# Patient Record
Sex: Female | Born: 1985 | Race: White | Hispanic: No | Marital: Married
Health system: Midwestern US, Community
[De-identification: ages and names within clinical notes are randomized; demographics above are authoritative.]

## PROBLEM LIST (undated history)

## (undated) DIAGNOSIS — R200 Anesthesia of skin: Secondary | ICD-10-CM

---

## 2008-11-24 LAB — COMPREHENSIVE METABOLIC PANEL
ALT: 16 U/L (ref 7–35)
AST: 22 U/L (ref 8–33)
Albumin,Serum: 4.5 g/dL (ref 3.5–5.0)
Alkaline Phosphatase: 83 U/L (ref 25–100)
BUN: 14 mg/dL (ref 6–23)
CO2: 22 mMol/L (ref 20–32)
Calcium: 9.2 mg/dL (ref 8.3–10.6)
Chloride: 108 mMol/L (ref 99–109)
Creatinine: 0.6 mg/dL (ref 0.6–1.1)
GFR African American: >60 mL/min/{1.73_m2}
GFR Non-African American: >60 mL/min/{1.73_m2}
Glucose, GTT - Fasting: 97 mg/dL (ref 74–106)
Potassium: 4 mmol/L (ref 3.5–5.1)
Sodium: 137 mmol/L (ref 136–145)
Total Bilirubin: 0.8 mg/dL (ref 0.3–1.2)
Total Protein: 7.1 g/dL (ref 6.4–8.3)

## 2008-11-24 LAB — CBC WITH AUTO DIFFERENTIAL
Basophils %: 1.2 % — ABNORMAL HIGH (ref 0–1)
Basophils Absolute: 0.1 10*3/uL (ref 0.0–0.1)
Eosinophils %: 2.5 % (ref 0–7)
Eosinophils Absolute: 0.2 10*3/uL (ref 0.0–0.6)
Hematocrit: 43.4 % (ref 37.0–47.0)
Hemoglobin: 14.9 g/dL (ref 12.0–16.0)
Lymphocytes %: 9.7 % — ABNORMAL LOW (ref 16–42)
Lymphocytes Absolute: 0.9 10*3/uL (ref 0.6–4.3)
MCH: 30.6 pg (ref 27–31)
MCHC: 34.3 % (ref 32.0–36.0)
MCV: 89.2 FL (ref 80–100)
MPV: 7.3 FL — ABNORMAL LOW (ref 7.5–11.1)
Monocytes %: 6 % (ref 4–12)
Monocytes Absolute: 0.6 10*3/uL (ref 0.2–1.1)
Neutrophils Absolute: 7.8 10*3/uL — ABNORMAL HIGH (ref 1.5–7.0)
Platelets: 230 10*3/uL (ref 150–400)
RBC: 4.87 10*6/uL (ref 4.20–5.40)
RDW: 13.3 % (ref 11.7–14.9)
Segs Relative: 80.6 % — ABNORMAL HIGH (ref 44–76)
WBC: 9.7 10*3/uL (ref 4.8–10.8)

## 2008-11-24 LAB — LIPASE: Lipase: 26 U/L (ref 6–51)

## 2008-11-24 LAB — AMYLASE: Amylase: 34 U/L (ref 30–118)

## 2010-01-20 ENCOUNTER — Inpatient Hospital Stay: Admit: 2010-01-20 | Discharge: 2010-01-20

## 2010-01-20 MED FILL — CLINDAMYCIN HCL 150 MG PO CAPS: 150 MG | ORAL | Qty: 1

## 2010-01-20 NOTE — Discharge Instructions (Signed)
[facility]SA OR ABSCESS Wound instructions    _x___ Warm Epsom Salt Water Compresses or Soaks Every Hour Today then 3-7 times daily for 5-7 days. Use 1/4 cup Epsom Salt to 16 Oz of hot water. Add a tablespoon of antibacterial soap to the water.    __x__ Hot Showers 2-3 times daily.    __x__ Clindamycin 321m as directed (usually 1 tablet 4 times a day)    __x__ Bactroban (Mupirocin) ointment as directed.   ____ " ribbon to each nostril 2-3 times a day and massage for one minute.   ____ Apply to the affected area 2 times a day and cover with a dressing.    __x__ Hibiclens (chlorohexidine) wash daily, head to toe, for one week then weekly. This is available over the counter at the pharmacy.     Keep wound covered and moist until healing has occurred.  This may take several weeks.      RETURN TO THE EMERGENCY DEPARTMENT IF THE AREA GETS REDDER, MORE SWOLLEN OR MORE PAINFUL.    YOU MAY CHOOSE TO FOLLOW UP AT [facility]  THEY SPECIALIZE IN SKIN INFECTIONS    THIS IS NOT A SPIDER BITE!    How can I prevent staph or MRSA skin infections?   Practice good hygiene:  1. Keep your hands clean by washing thoroughly with soap and water or using an alcohol-based hand sanitizer.   2. Keep cuts and scrapes clean and covered with a bandage until healed.   3. Avoid contact with other people's wounds or bandages.   4. Avoid sharing personal items such as towels or razors.  Information from this web site:  HBetaBlues.pl Abscess/Boil  (Furuncle)     An abscess (boil or furuncle) is an infected area that contains a collection of pus.     SYMPTOMS  Signs and symptoms of an abscess include pain, tenderness, redness, or hardness. You may feel a moveable soft area under your skin. An abscess can occur anywhere in the body.      TREATMENT  An incision (cut by the caregiver) may have been made over your abscess so the pus could be drained out. Gauze may have been packed into the space or a drain may  have been looped thru the abscess cavity (pocket). This provides a drain that will allow the cavity to heal from the inside outwards. The abscess may be painful for a few days, but should feel much better if it was drained. Your abscess, if seen early, may not have localized and may not have been drained. If not, another appointment may be required if it does not get better on its own or with medications.     See your caregiver as directed for a recheck or sooner if you develop any of the symptoms described above. Take antibiotics (medicine that kills germs) as directed if they were prescribed.     MAKE SURE YOU:    Understand these instructions.    Will watch your condition.   Will get help right away if you are not doing well or get worse.     Document Released: 11/02/2004  Document Re-Released: 11/20/2008  [facility] Information 2011 [place]              Wound Care  Good wound care will help reduce pain and prevent infection. Puncture wounds and deep cuts are more likely to become infected than scrapes and abrasions. There is a greater chance of infection if  the injured person is diabetic and/or has a weakened immune system (such as when a cancer patient is being treated with chemotherapy and/or radiation).  HOME CARE INSTRUCTIONS  General wound care treatment includes:   Rest and elevate the injured area until the pain and swelling are better.    All foreign material like bits of dirt must be washed off.    Wounds should be cleaned daily with gentle soap and water.    Dressings may be needed to protect the wound from further damage.    Apply antibiotic cream or ointment after the wound has been cleaned.   SEEK MEDICAL CARE IF YOU DEVELOP:   Increased redness or swelling in or around the wound.    Increasing pain.   SEEK IMMEDIATE MEDICAL CARE IF YOU DEVELOP:   Temperature of 102F (38.9 C) or higher.    A pus like drainage from the wound.    Severe pain not controlled with  non-prescription or prescription pain relievers.    Inability to move fingers or toes if the wound affects these areas.    Red streaking of the skin extending above and/or below the wound.   A tetanus shot may be needed if you have not had a booster within the past 5 years.  Document Released: 03/02/2004 Document Re-Released: 04/19/2009  [facility] Patient Information 2011 [place].

## 2010-01-20 NOTE — ED Provider Notes (Signed)
eMERGENCY dEPARTMENT eNCOUnter        CHIEF COMPLAINT    Chief Complaint   Patient presents with   ??? Abscess     2 abscesses, chin and abd       HPI    Jessica Krause is a 24 y.o. female who presents with a history of multiple open sores On shin and abdomen, along Multiple other wounds had varying degrees of healing.  Patient states she began having problems with open sores on her back axilla side abdomen face and scalp after she had a tattoo placed over a year ago.  She states her family doctor has treated her with at least 2 courses of Bactrim but she continues to develop open sores.  She currently an open draining sore in the center of her chin and on her lower left abdominal fold just above her belt line.  Both sores are nonfluctuant but do have draining at this time.  Patient offers several concerns and to why she continues to develop sores in concerns of possibility MRSA and passing on to her children.  Patient denies pain at the areas but is just more concerned about the infectious nature of the sores.  Patient's past medical history is significant for asthma migraines and recurring open sores.      REVIEW OF SYSTEMS    See HPI for further details. Review of systems otherwise negative.     PAST MEDICAL HISTORY    Past Medical History   Diagnosis Date   ??? Asthma    ??? Migraine        SURGICAL HISTORY    Past Surgical History   Procedure Date   ??? Cholecystectomy    ??? Hysterectomy    ??? Anterior cruciate ligament repair        CURRENT MEDICATIONS    Current Outpatient Rx   Name Route Sig Dispense Refill   ??? IBUPROFEN 200 MG PO CAPS Oral Take 4 capsules by mouth continuous prn.     ??? CLINDAMYCIN HCL 150 MG PO CAPS Oral Take 2 capsules by mouth 4 times daily for 7 days. 28 capsule 0   ??? MUPIROCIN 2 % EX OINT  Apply to nares 2 times a day 1 Tube 0       ALLERGIES    Allergies   Allergen Reactions   ??? Amoxicillin    ??? Inderal (Propranolol Hcl)        FAMILY HISTORY    History reviewed. No pertinent family  history.    SOCIAL HISTORY    History     Social History   ??? Marital Status: Married     Spouse Name: N/A     Number of Children: N/A   ??? Years of Education: N/A     Social History Main Topics   ??? Smoking status: Current Everyday Smoker   ??? Smokeless tobacco: None   ??? Alcohol Use: Yes      social   ??? Drug Use: No   ??? Sexually Active:      Other Topics Concern   ??? None     Social History Narrative   ??? None       PHYSICAL EXAM    VITAL SIGNS: BP 135/96   Pulse 106   Temp(Src) 96.5 ??F (35.8 ??C) (Oral)   Resp 16   Ht 5\' 8"  (1.727 m)   Wt 265 lb (120.203 kg)   BMI 40.29 kg/m2   SpO2 99%  Constitutional:  Well developed, well nourished, no acute distress, non-toxic appearance   HENT:  Atraumatic, external ears normal, nose normal, oropharynx moist, no pharyngeal exudates. Neck- supple   Respiratory:  No respiratory distress, normal breath sounds   Cardiovascular:  Normal rate, normal rhythm, no murmurs, no gallops, no rubs   GI:  Soft, nondistended, normal bowel sounds, nontender, no organomegaly   Musculoskeletal:  No edema, no tenderness, no deformities.   Integument:  Open sore noted on her center chin measuring half a centimeter by half centimeter currently with an area of dry healing tissue, additional sore noted on lower abdominal fold and with Small amount of drainage noted.  There is no cellulitis noted at either area.  There are several areas on her right upper back shoulder region, left and right axilla areas, right scalp, and lower right abdomen which demonstrate prior open wound scars, all areas are free of cellulitis or drainage at this time       MEDICAL DECISION MAKING    Vitals:    Filed Vitals:    01/20/10 1622   BP: 135/96   Pulse: 106   Temp: 96.5 ??F (35.8 ??C)   TempSrc: Oral   Resp: 16   Height: 5\' 8"  (1.727 m)   Weight: 265 lb (120.203 kg)   SpO2: 99%       MEDICAL DECISION MAKING / ED COURSE:      Vital signs and nursing notes reviewed during ED course.  Patient presentation and care were discussed  with Dr. Carvel Getting prior to discharge.  Patient was instructed in skin care and treatment of both current wounds and resolving wounds, which included the use of Hibiclens showers, warm compresses, Bactroban nasally, and completion of antibiotic treatment.  Patient is to followup with her primary care provider once antibiotics are completed for additional testing and concerns.  Wound cultures were not obtained at this time secondary to patient's request As patient states she knows she has had MRSA in the past.    The patient tolerated their visit well.  They were seen and evaluated by the attending physician who agreed with the assessment and plan.  The patient and / or the family were informed of the results of any tests, a time was given to answer questions, a plan was proposed and they agreed with plan.      CLINICAL IMPRESSION:  1. Wounds, multiple          DISCHARGE MEDICATIONS:  New Prescriptions    CLINDAMYCIN (CLEOCIN) 150 MG CAPSULE    Take 2 capsules by mouth 4 times daily for 7 days.    MUPIROCIN (BACTROBAN) 2 % OINTMENT    Apply to nares 2 times a day       (Please note the MDM and HPI sections of this note were completed with a voice recognition program.  Efforts were made to edit the dictations but occasionally words are mis-transcribed.)        Jessica Bouillon, NP  01/20/10 2220    This patient was seen by the mid-level provider.  I have seen and examined the patient, agree with the workup, evaluation, management and diagnosis.  Care plan has been discussed.      Jessica Saxon, MD  01/20/10 [phone]

## 2010-11-29 ENCOUNTER — Encounter

## 2010-11-29 NOTE — Patient Instructions (Signed)
Patient care instructions given.

## 2010-11-29 NOTE — Progress Notes (Signed)
Subjective:       Jessica Krause is a 25 y.o. right handed female referred by Dr. Benito Mccreedy for evaluation and treatment bilateral wrist pain, right worse than left. This is evaluated as a personal injury. The onset of the pain was gradual, starting about 2 months ago. The pain is described as numbing and tingling.   She states that she first noticed symptoms in her right hand where she was getting intermittent numbness and muscle twitches in her right thumb.  Since that time her symptoms have progressed to the point that she is now having continuous numbness and tingling and pain in the right hand.  Then over the past couple weeks she has begun noticing similar symptoms in her left hand.   The patient has had night time symptoms.  Restricted activities include: repetitive use pattern, limited grip activities, limited fine motor / sensory discrimination. The patient has not had Physical Therapy for these symptoms.  The pain is relieved by rest, avoiding the painful activities, stretching. EMG studies were not done.  She denies recent injury to her bilateral hands and denies fever or chills.  She states that now she's beginning to feel clumsy and is dropping things with her right hand.    Pain is currently rated at  7/10.    Outside reports reviewed: none.    Past Medical History   Diagnosis Date   . Asthma    . Migraine    . Arthritis      Past Surgical History   Procedure Date   . Cholecystectomy    . Hysterectomy    . Anterior cruciate ligament repair      History reviewed. No pertinent family history.  History     Social History   . Marital Status: Married     Spouse Name: N/A     Number of Children: N/A   . Years of Education: N/A     Social History Main Topics   . Smoking status: Current Everyday Smoker   . Smokeless tobacco: None   . Alcohol Use: Yes      social   . Drug Use: No   . Sexually Active:      Other Topics Concern   . None     Social History Narrative   . None     Current Outpatient  Prescriptions   Medication Sig Dispense Refill   . NAPROXEN Take  by mouth 2 times daily.         Marland Kitchen ibuprofen (ADVIL;MOTRIN) 200 MG CAPS Take 4 capsules by mouth continuous prn.         Allergies   Allergen Reactions   . Amoxicillin    . Inderal (Propranolol Hcl)         Review of Systems - General ROS: negative for - chills, fever or night sweats  Musculoskeletal ROS: positive for - joint pain bilateral hands, right worse than left  negative for - prior injury to bilateral hands  Neurological ROS: positive for - numbness/tingling or weakness  Dermatological ROS: negative for - erythema, lacerations       Objective:      BP 118/82  Pulse 76  Temp(Src) 98.9 F (37.2 C) (Temporal)  Resp 16  Ht 5\' 8"  (1.727 m)  Wt 252 lb (114.306 kg)  BMI 38.32 kg/m2    General :    Alert and oriented times three, appears stated age, cooperative and no distress.  Mood: Normal and appropriate, no  agitation     Gait:  Normal. The patient can bear weight on the injured extremity.   Bilateral Upper Extremity  Swelling:  none noted   Bruising:   none noted   Tenderness:   present in palmar area over the carpal tunnel   Wrist ROM:   palmar flexion 90/90, dorsiflexion 90/90, pronation 90/90, supination 90/90, flexion contracture 0/0, extension contracture 0/0, radial deviation 25/25, ulnar deviation 25/25   Atrophy:  absent   Strength:   grip 5/5, wrist extension 5/5, and wrist flexion 5/5, interossi 5/5; mild grip weakness on the right side compared to the contralateral side   Sensation:  numbness in the bilateral median region, tingling in the bilateral median region   Two-Point Discr.:   decreased in the bilateral median region   Phalen's:   positive after 20 seconds bilaterally   Tinel's   positive bilaterally, right worse than left     Imaging  X-ray 3 view of right wrist obtained and reviewed by me today in the office demonstrate no acute osseous abnormalities, no bony prominences, no loose bodies.    X-ray 3 view of left wrist  obtained and reviewed by me today in the office demonstrate no acute osseous abnormalities, no bony prominences, no loose bodies.  .      Assessment:      Carpal Tunnel Syndrome, bilateral, right worse than left       Plan:      I discussed with her today her x-ray findings.  Given her persistent symptoms I would like to obtain an EMG of her bilateral upper extremities; she states that she has a scheduled for November 9.  Following this I briefly discussed performing carpal tunnel release with her today.  Continue weight-bearing as tolerated.  Continue range of motion exercises as instructed.  Ice and elevate as needed.  Tylenol or Motrin for pain.  Follow-up in one week after EMG for further discussion of surgical treatment beginning on the right side.

## 2010-12-16 NOTE — Procedures (Signed)
PATIENT NAME:                   PA #:             MR #CLETTA, NOLDEN                   [id number]        [id number]         ATTENDING PHYSICIAN:                     VISIT DATE:  DIS DATE:       Benito Mccreedy, MD                    12/16/2010                   DATE OF BIRTH:     AGE:            PATIENT TYPE:      RM #:           February 09, 1985         25              OPT                                      STUDIES REQUESTED:  Electromyograph.      REFERRING PHYSICIAN:  Dr. Hector Brunswick.      CLINICAL PROBLEM:  Pain and dysesthesia, both hands and arms.     IMPRESSION:       1.  Moderate right carpal tunnel syndrome with prolongation of motor and  sensory distal latencies of right median nerve, sensory more than motor.   Right median transcarpal sensory latency is prolonged.  Mild neurogenic  electromyographic changes are seen in right thenar muscles.       2.  Left median sensory distal latency and transcarpal sensory latency are on  the upper limit of normal to slightly prolonged; left median motor distal  latency is normal.  No electromyographic changes are appreciated in the left  thenar muscles.  Findings are consistent with very early left carpal tunnel  syndrome.       3.  No other electromyographic changes are seen in the right and left arm  muscles.  Nerve conduction velocities are within normal limits.                                                             Elesia Pemberton L. Lanny Hurst, MD           [id number]  DD: 12/16/2010 13:03   DT: 12/16/2010 15:20   Job #: [id number]  CC: Chidi Shirer Victorio Palm, MD  CC: Benito Mccreedy, MD

## 2010-12-20 MED ORDER — CLINDAMYCIN HCL 150 MG PO CAPS
150 MG | ORAL_CAPSULE | Freq: Four times a day (QID) | ORAL | Status: DC
Start: 2010-12-20 — End: 2010-12-21

## 2010-12-20 MED ORDER — OXYCODONE-ACETAMINOPHEN 5-325 MG PO TABS
5-325 MG | ORAL_TABLET | Freq: Four times a day (QID) | ORAL | Status: DC | PRN
Start: 2010-12-20 — End: 2011-01-13

## 2010-12-20 NOTE — Patient Instructions (Signed)
Patient care instructions given.

## 2010-12-20 NOTE — Progress Notes (Signed)
Subjective:      Patient ID: Jessica Krause is a 25 y.o. Caucasian female.  She comes in today for a recheck of her bilateral hand numbness and tingling, right worse than left as well as follow-up after EMG of her bilateral upper extremities.  She continues to complain of numbness and tingling in her hands. In particular her right hand is progressively worsening and continues to wake her up at night.  She is now also getting to have muscle twitches in her left thumb which is similar to how her right hand symptoms started.  She continues to deny fevers or chills and denies any recent injury to her hands.    Pain is currently rated at  4/10.      Chief Complaint   Patient presents with   ??? Arm Pain     bilateral          Social History     Occupational History   ??? Not on file.     Social History Main Topics   ??? Smoking status: Current Everyday Smoker   ??? Smokeless tobacco: Not on file   ??? Alcohol Use: Yes      social   ??? Drug Use: No   ??? Sexually Active:       Review of Systems   Constitutional: Negative for fever and chills.   Musculoskeletal: Positive for joint pain. Negative for myalgias.   Neurological: Positive for tingling, sensory change and focal weakness.       Objective:   Physical Exam   Constitutional: She is oriented to person, place, and time. She appears well-developed and well-nourished.   Musculoskeletal: Normal range of motion. She exhibits tenderness. She exhibits no edema.        Right hand: She exhibits tenderness and disruption of two-point discrimination. She exhibits normal range of motion, no bony tenderness, normal capillary refill, no deformity, no laceration and no swelling. decreased sensation noted. Decreased sensation is present in the medial distribution. Decreased sensation is not present in the ulnar distribution and is not present in the radial distribution. Decreased strength noted. She exhibits thumb/finger opposition.        Left hand: She exhibits tenderness and disruption of  two-point discrimination. She exhibits normal range of motion, no bony tenderness, normal capillary refill, no deformity, no laceration and no swelling. decreased sensation noted. Decreased sensation is present in the medial distribution. Decreased sensation is not present in the ulnar distribution and is not present in the radial distribution. Decreased strength noted. She exhibits thumb/finger opposition.   Neurological: She is alert and oriented to person, place, and time. She displays no atrophy. A sensory deficit is present. She exhibits normal muscle tone.   Skin: Skin is warm, dry and intact. No abrasion, no bruising and no ecchymosis noted. No erythema.     EMG report of the bilateral upper extremities reviewed by me today in the office demonstrates moderate carpal tunnel syndrome on the right with very early carpal tunnel syndrome on the left    Assessment:     1. Carpal tunnel syndrome      Carpal tunnel syndrome, right worse than left    Plan:     I discussed with her today her EMG findings.  Given her persistent and worsening symptoms I recommend surgical treatment.   I recommend beginning on the right side which is her more symptomatic side.  I discussed performing right carpal tunnel release with her today.  I explained  risks, benefits, possible complications of the procedure and answered all questions for the patient.  The patient understands and consents to the procedure.  We will schedule surgery at soonest convenience.  I explained postoperative rehabilitation.  Patient will follow up with their primary care physician prior to surgical treatment for preoperative clearance.  Continue weight-bearing as tolerated.  Continue range of motion exercises as instructed.  Ice and elevate as needed.  Tylenol or Motrin for pain.  I gave her a brace to wear on her left side.  Follow-up will be two weeks postop for suture removal at which point we will discuss proceeding with surgical treatment for her left  hand.

## 2010-12-20 NOTE — Progress Notes (Signed)
This encounter was created in error - please disregard.

## 2010-12-20 NOTE — Patient Instructions (Signed)
Surgery:            12/21/10@ 1015              Arrival time: 0845  Nothing to eat or drink after midnight unless instructed to take certain medications by the doctor or the nurse the am of surgery  Arrive at the front information desk -1st floor /SDS is on [address]  Please leave money and all other valuables at home.  Wear comfortable clothing. If you wear contacts please bring a case.  No make up. You may be asked to remove rings or body piercing.  Please bring insurance cards and picture ID am of procedure.  Please bring any consent or paper work from your doctor.  If you become ill,such as a cold, sore throat or fever contact your doctor.  Please bathe or shower am of procedure.  Medications to take AM of procedure:     Any questions call SDS  [phone]

## 2010-12-21 MED ORDER — PROMETHAZINE HCL 25 MG/ML IJ SOLN
25 MG/ML | Freq: Once | INTRAMUSCULAR | Status: AC | PRN
Start: 2010-12-21 — End: 2010-12-21
  Administered 2010-12-21: 18:00:00 via INTRAVENOUS

## 2010-12-21 MED ORDER — NORMAL SALINE FLUSH 0.9 % IV SOLN
0.9 % | Freq: Two times a day (BID) | INTRAVENOUS | Status: DC
Start: 2010-12-21 — End: 2010-12-21

## 2010-12-21 MED ORDER — NORMAL SALINE FLUSH 0.9 % IV SOLN
0.9 % | INTRAVENOUS | Status: DC | PRN
Start: 2010-12-21 — End: 2010-12-21

## 2010-12-21 MED ORDER — OXYCODONE-ACETAMINOPHEN 5-325 MG PO TABS
5-325 MG | ORAL | Status: DC | PRN
Start: 2010-12-21 — End: 2010-12-21

## 2010-12-21 MED ORDER — LACTATED RINGERS IV SOLN
INTRAVENOUS | Status: DC
Start: 2010-12-21 — End: 2010-12-21
  Administered 2010-12-21: 14:00:00 via INTRAVENOUS

## 2010-12-21 MED ORDER — MEPERIDINE HCL 25 MG/ML IJ SOLN
25 MG/ML | INTRAMUSCULAR | Status: DC | PRN
Start: 2010-12-21 — End: 2010-12-21

## 2010-12-21 MED ORDER — LIDOCAINE HCL 1 % IJ SOLN
1 | INTRAMUSCULAR | Status: AC
Start: 2010-12-21 — End: ?

## 2010-12-21 MED ORDER — SODIUM CHLORIDE 0.9 % IV SOLN
0.9 % | Freq: Once | INTRAVENOUS | Status: DC
Start: 2010-12-21 — End: 2010-12-21

## 2010-12-21 MED ORDER — MIDAZOLAM HCL 2 MG/2ML IJ SOLN
2 MG/ML | Freq: Once | INTRAMUSCULAR | Status: DC | PRN
Start: 2010-12-21 — End: 2010-12-21

## 2010-12-21 MED ORDER — HYDROMORPHONE HCL 1 MG/ML IJ SOLN
1 MG/ML | INTRAMUSCULAR | Status: DC | PRN
Start: 2010-12-21 — End: 2010-12-21

## 2010-12-21 MED ORDER — ENALAPRILAT 1.25 MG/ML IV INJ
1.25 MG/ML | Freq: Once | INTRAVENOUS | Status: DC | PRN
Start: 2010-12-21 — End: 2010-12-21

## 2010-12-21 MED ORDER — LACTATED RINGERS IV SOLN
INTRAVENOUS | Status: DC
Start: 2010-12-21 — End: 2010-12-21

## 2010-12-21 MED ORDER — FENTANYL CITRATE 0.05 MG/ML IJ SOLN
0.05 MG/ML | INTRAMUSCULAR | Status: DC
Start: 2010-12-21 — End: 2010-12-21

## 2010-12-21 MED ORDER — CLINDAMYCIN PHOSPHATE 600 MG/4ML IV SOLN
600 MG/4ML | INTRAVENOUS | Status: AC
Start: 2010-12-21 — End: 2010-12-21
  Administered 2010-12-21: 17:00:00 via INTRAVENOUS

## 2010-12-21 MED ORDER — ONDANSETRON HCL 4 MG/2ML IJ SOLN
4 MG/2ML | INTRAMUSCULAR | Status: DC | PRN
Start: 2010-12-21 — End: 2010-12-21

## 2010-12-21 MED ORDER — OXYCODONE-ACETAMINOPHEN 5-325 MG PO TABS
5-325 MG | ORAL | Status: AC
Start: 2010-12-21 — End: 2010-12-21
  Administered 2010-12-21: 19:00:00 via ORAL

## 2010-12-21 MED ORDER — DEXAMETHASONE SODIUM PHOSPHATE 4 MG/ML IJ SOLN
4 | INTRAMUSCULAR | Status: AC
Start: 2010-12-21 — End: ?

## 2010-12-21 MED ORDER — KETOROLAC TROMETHAMINE 30 MG/ML IJ SOLN
30 MG/ML | Freq: Once | INTRAMUSCULAR | Status: DC | PRN
Start: 2010-12-21 — End: 2010-12-21

## 2010-12-21 MED ORDER — DIPHENHYDRAMINE HCL 50 MG/ML IJ SOLN
50 MG/ML | Freq: Once | INTRAMUSCULAR | Status: DC | PRN
Start: 2010-12-21 — End: 2010-12-21

## 2010-12-21 MED ORDER — HYDRALAZINE HCL 20 MG/ML IJ SOLN
20 MG/ML | INTRAMUSCULAR | Status: DC | PRN
Start: 2010-12-21 — End: 2010-12-21

## 2010-12-21 MED ORDER — MORPHINE SULFATE 10 MG/ML IJ SOLN
10 MG/ML | INTRAMUSCULAR | Status: DC | PRN
Start: 2010-12-21 — End: 2010-12-21

## 2010-12-21 MED ORDER — METOCLOPRAMIDE HCL 5 MG/ML IJ SOLN
5 MG/ML | Freq: Once | INTRAMUSCULAR | Status: DC | PRN
Start: 2010-12-21 — End: 2010-12-21

## 2010-12-21 MED ORDER — FENTANYL CITRATE 0.05 MG/ML IJ SOLN
0.05 MG/ML | INTRAMUSCULAR | Status: DC | PRN
Start: 2010-12-21 — End: 2010-12-21

## 2010-12-21 MED ORDER — ONDANSETRON HCL 4 MG/2ML IJ SOLN
4 MG/2ML | Freq: Once | INTRAMUSCULAR | Status: AC | PRN
Start: 2010-12-21 — End: 2010-12-21
  Administered 2010-12-21: 18:00:00 via INTRAVENOUS

## 2010-12-21 MED ORDER — HYDROMORPHONE HCL 1 MG/ML IJ SOLN
1 MG/ML | INTRAMUSCULAR | Status: DC | PRN
Start: 2010-12-21 — End: 2010-12-21
  Administered 2010-12-21: 18:00:00 0.5 mg via INTRAVENOUS

## 2010-12-21 MED FILL — ONDANSETRON HCL 4 MG/2ML IJ SOLN: 4 MG/2ML | INTRAMUSCULAR | Qty: 2

## 2010-12-21 MED FILL — FENTANYL CITRATE 0.05 MG/ML IJ SOLN: 0.05 MG/ML | INTRAMUSCULAR | Qty: 2

## 2010-12-21 MED FILL — LIDOCAINE HCL 1 % IJ SOLN: 1 % | INTRAMUSCULAR | Qty: 20

## 2010-12-21 MED FILL — DEXAMETHASONE SODIUM PHOSPHATE 4 MG/ML IJ SOLN: 4 MG/ML | INTRAMUSCULAR | Qty: 1

## 2010-12-21 MED FILL — HYDROMORPHONE HCL PF 1 MG/ML IJ SOLN: 1 MG/ML | INTRAMUSCULAR | Qty: 1

## 2010-12-21 MED FILL — OXYCODONE-ACETAMINOPHEN 5-325 MG PO TABS: 5-325 MG | ORAL | Qty: 1

## 2010-12-21 MED FILL — CLEOCIN PHOSPHATE 600 MG/4ML IV SOLN: 600 MG/4ML | INTRAVENOUS | Qty: 4

## 2010-12-21 MED FILL — LACTATED RINGERS IV SOLN: INTRAVENOUS | Qty: 1000

## 2010-12-21 MED FILL — PROMETHAZINE HCL 25 MG/ML IJ SOLN: 25 MG/ML | INTRAMUSCULAR | Qty: 1

## 2010-12-21 NOTE — Discharge Instructions (Signed)
[facility]  [phone]

## 2010-12-21 NOTE — Anesthesia Post-Procedure Evaluation (Signed)
Post-op pain: Adequate analgesia     Post-op assessment: no apparent anesthetic complications    Post-op vital signs: stable     Level of consciousness: awake, alert and oriented     Complications: none

## 2010-12-21 NOTE — Progress Notes (Signed)
Report received from Karolee Stamps CRNA

## 2010-12-21 NOTE — Progress Notes (Signed)
Discharged to SDS per bed, handoff bedside report given to L. Fancett RN

## 2010-12-21 NOTE — Plan of Care (Signed)
Nursing Diagnosis -  High risk for fluid volume deficit related to inadequate fluid intake.  Expected Outcome - Afebrile. Oral intake taken to patient's optimum capability without nausea and vomiting.  Nursing Actions - Oral fluids offered post-procedure.  I.V. fluids continued until oral intake adequate without nausea and vomiting.  Temperature monitored post-procedure. Physician notified of nausea/vomiting or temperature elevation.    Nursing Diagnosis - Alteration in comfort related to procedure as evidenced by pain scale rating.  Expected Outcome - Reduction of pain.  Increase of activity to optimum level.  Nursing Actions - Assess pain using appropriate pain scale.  Evaluate patient for post-procedure complications.  Give pain medication as ordered or notify physician.  Re-evaluate pain per pain scale per policy.    Nursing Diagnosis - High risk for altered health maintenance related to insufficient knowledge of post-procedure restrictions, medications and  post-procedure complications/needs.  Expected Outcomes - Patient, family and/or significant others verbalize understanding of discharge instructions.  Nursing Actions - Review post-procedure discharge instructions with patient, family and/or significant others.  Provide written information regarding new prescription medications and review with patient until understanding is verbalized.    I have reviewed each nursing diagnosis, expected outcome and nursing actions.  All apply to this patient.  Each goal has been met on this date.

## 2010-12-21 NOTE — Anesthesia Pre-Procedure Evaluation (Signed)
Jessica Krause     Anesthesia Evaluation     Patient summary reviewed    No history of anesthetic complications   Airway   Mallampati: I  TM distance: >3 FB  Neck ROM: full  Dental - normal exam     Pulmonary    (+) asthma,   ROS comment: smoker  Cardiovascular - negative ROS  Beta Blocker:  Not on Beta Blocker    Neuro/Psych    (+) headaches (migraines), psychiatric history (bipolar disorder)  (-) neuromuscular disease  GI/Hepatic/Renal      Comments: Irritable bowel syndrome    Endo/Other  (+) , arthritis  Abdominal            Proposed Procedure: right CTR    Past Medical History   Diagnosis Date   ??? Asthma    ??? Migraine    ??? Arthritis    ??? IBS (irritable bowel syndrome)      "was dx prior to gallbladder surgery but no trouble since the gb removed"   ??? Bipolar 1 disorder    ??? Anesthesia      "I wake up freaking out after surgery"       Past Surgical History   Procedure Date   ??? Anterior cruciate ligament repair      right knee 2010(pc)   ??? Cholecystectomy age 89-2004   ??? Hysterectomy age 11     "removed everything but the ovaries and had to repair my bladder"   ??? Dental surgery      "wisdom teeth removed age 66, put to sleep"        History     Social History   ??? Marital Status: Married     Spouse Name: N/A     Number of Children: N/A   ??? Years of Education: N/A     Occupational History   ??? Not on file.     Social History Main Topics   ??? Smoking status: Current Everyday Smoker -- 0.5 packs/day for 10 years   ??? Smokeless tobacco: Never Used   ??? Alcohol Use: Yes      social, quit over a year ago   ??? Drug Use: No   ??? Sexually Active:      Other Topics Concern   ??? Not on file     Social History Narrative   ??? No narrative on file        Present on Admission:   ???Carpal tunnel syndrome     Allergies   Allergen Reactions   ??? Inderal (Propranolol Hcl) Anaphylaxis     Heart rate dropped, couldn't walk   ??? Amoxicillin Rash     "bloody stool"       Prescriptions prior to admission:NAPROXEN, Take  by mouth 2 times  daily.  ibuprofen (ADVIL;MOTRIN) 200 MG CAPS, Take 4 capsules by mouth continuous prn.  oxyCODONE-acetaminophen (PERCOCET) 5-325 MG per tablet, Take 1-2 tablets by mouth every 6 hours as needed for Pain for 30 days.    Hospital Medications:    0.9%  NaCl infusion Once   lactated ringers infusion Continuous   sodium chloride 0.9 % injection 10 mL Q12H [facility]   sodium chloride 0.9 % injection 10 mL PRN   HYDROmorphone (DILAUDID) injection 0.25 mg Q5 Min PRN   midazolam (VERSED) injection 2 mg Once PRN   ondansetron (ZOFRAN) injection 4 mg PRN   lactated ringers infusion Continuous   sodium chloride 0.9 % injection 10 mL Q12H [facility]  sodium chloride 0.9 % injection 10 mL PRN   clindamycin (CLEOCIN) 600mg  IVPB 50ml add-vantage D5W On Call to OR   HYDROmorphone (DILAUDID) injection 0.5 mg Q5 Min PRN   ketorolac (TORADOL) injection 30 mg Once PRN   morphine injection 1 mg Q5 Min PRN   fentaNYL (SUBLIMAZE) injection 50 mcg Q5 Min PRN   morphine injection 2 mg Q5 Min PRN   HYDROmorphone (DILAUDID) injection 0.5 mg Q5 Min PRN   diphenhydrAMINE (BENADRYL) injection 12.5 mg Once PRN   metoclopramide (REGLAN) injection 10 mg Once PRN   ondansetron (ZOFRAN) injection 4 mg Once PRN   promethazine (PHENERGAN) injection 6.25 mg Once PRN   hydrALAZINE (APRESOLINE) injection 5 mg Q10 Min PRN   enalaprilat (VASOTEC) injection 1.25 mg Once PRN   meperidine (DEMEROL) injection 12.5 mg Q5 Min PRN       Recent Vitals:  Filed Vitals:    12/21/10 0903   BP: 110/69   Pulse: 80   Temp: 98.7 ??F (37.1 ??C)   Resp: 16     Wt Readings from Last 3 Encounters:   12/20/10 252 lb (114.306 kg)   11/29/10 252 lb (114.306 kg)   01/20/10 265 lb (120.203 kg)      Ht Readings from Last 3 Encounters:   12/20/10 5\' 8"  (1.727 m)   11/29/10 5\' 8"  (1.727 m)   01/20/10 5\' 8"  (1.727 m)     Body mass index is 38.32 kg/(m^2).        EKG:     LABS:    CBC  Lab Results   Component Value Date/Time    WBC 9.7 11/24/2008 10:10 AM    HGB 14.9 11/24/2008 10:10 AM    HCT 43.4  11/24/2008 10:10 AM    PLT 230 11/24/2008 10:10 AM     RENAL  Lab Results   Component Value Date/Time    NA 137 11/24/2008 10:10 AM    K 4.0 11/24/2008 10:10 AM    CL 108 11/24/2008 10:10 AM    CO2 22 11/24/2008 10:10 AM    BUN 14 11/24/2008 10:10 AM    CREATININE 0.6 11/24/2008 10:10 AM     COAGS  No results found for this basename: protime, inr, aptt       No results found for this basename: hCGQUANT     No results found for this basename: PREGTESTUR, PREGSERUM, HCG, HCGQUANT     @BRIEFLABABG (PHART,PCO2ART,PO2ART,HCO3ART,BEART,THGBART,TCO2ART,O2SATART)@              Allergies: Inderal and Amoxicillin    NPO Status: Time of last liquid consumption: 2245                       Time of last solid food consumption: 1845    Anesthesia Plan    ASA 2     general   (TIVA)  intravenous induction   Anesthetic plan and risks discussed with patient.          Charisse Klinefelter  12/21/2010

## 2010-12-21 NOTE — Plan of Care (Signed)
Diagnosis- Knowledge deficit related to procedure as evidenced by patient asking questions.  Knowledge deficit related to pre-procedure routine.  Expected Outcome - Verbalizes understanding of procedure and necessary preparations.  Nursing Actions - Pre-operative teaching, including NPO and printed pre-procedure instruction sheet.  Verbalizes understanding of pre-procedure teaching.    Nursing Diagnosis - Potential for anxiety related to lack of knowledge of procedure.  Expected Outcome - Demonstrates decreased anxiety.  Nursing Actions - Give clear, concise explanations.  Keep other caregivers informed.  Convey caring , supportive attitude.    Nursing Diagnosis - Potential for injury.  Expected Outcome - Patient will remain injury free.  Nursing Actions - Complete patient assessment. Provide monitoring as indicated. Place nursing call light within reach.  Secure safety straps/siderails during procedure.  Provide printed post-procedure instructions to out-patients.    I have reviewed each nursing diagnosis, expected outcome and nursing actions.  All apply to this patient.  Each goal has been met on this date.

## 2010-12-21 NOTE — Op Note (Signed)
PATIENT NAME:                 PA #:            MR #Jessica Krause, Jessica Krause                 [id number]       [id number]            SURGEON:                              SURG DATE:  DIS DATE:          Candelaria Celeste, DO                   12/21/2010                     DATE OF BIRTH:   AGE:           PATIENT TYPE:     RM #:              24-Feb-1985       25             OST               TIS                   PREOPERATIVE DIAGNOSIS:  Right carpal tunnel syndrome.     POSTOPERATIVE DIAGNOSIS:  Right carpal tunnel syndrome.     PROCEDURE:  Right carpal tunnel release.     ANESTHESIA:  General.     ESTIMATED BLOOD LOSS:  1 mL     TOTAL TOURNIQUET TIME:  12 minutes.     FLUIDS:  800 mL crystalloids.     INDICATIONS FOR PROCEDURE:  The patient is a 25 year old female with  long-standing history of pain, numbness and tingling in her right upper  extremity.  For this she underwent conservative treatment with no relief of  her symptoms.  An EMG was then obtained, which showed evidence of moderate  carpal tunnel syndrome in the right hand.  Given her persistent symptoms and  with her EMG findings I recommended surgical treatment.  I explained the  risks, benefits and possible complications of the procedure to the patient  and after answering all of her questions she consented to undergo the above  procedure.     REPORT OF PROCEDURE:  The patient was seen and evaluated in the preoperative  holding area where the right upper extremity was signed in her presence.  At  this point care of the patient was turned over to the Anesthesia team who  transported her back to the operative suite.  She was placed supine on the  operating table with her right upper extremity on an arm board.  General  anesthesia was administered and once adequate anesthesia was obtained the  right upper extremity was prepped and draped in the usual sterile fashion.   Preoperative antibiotics were administered.  At this point timeout was  performed and  all in attendance were in agreement.     I exsanguinated the right upper extremity with the use of an Esmarch and  tourniquet was inflated to 250 mmHg.  I then marked out the planned surgical  incision beginning distally at the intersection of [facility] cardinal  line and  the radial border of the ring finger and extending proximally for about 1.5  cm.  I then made an incision in this location and carried sharp dissection  down to the level of the transverse carpal ligament.  I then encountered a  small amount of palmaris brevis muscle belly, which I reflected with the use  of a scalpel.  I then inserted a Weitlaner retractor for further  visualization.  Directly overlying the median nerve I did find there to be no  appreciable thick band of transverse carpal ligament, but there was a  significant amount of fatty tissue on top of the nerve.  I then placed a  Ragnell retractor at the distal extent of the incision and then used the tip  of the scissors to debride a small amount of adhesion overlying the median  nerve.  I then bluntly probed to ensure that complete release had been  obtained.  I then placed a Ragnell retractor at the proximal extent of the  incision and then bluntly dissected superficially and deep to the transverse  carpal ligament proximally and did find there to be a thickened band of  transverse carpal ligament in that direction.  I then positioned the tip of  the scissors on the transverse carpal ligament and applied gentle pressure in  the proximal direction incising the thick band of the transverse carpal  ligament proximally.  I then bluntly probed to ensure the complete release  had been obtained.  Once I visualized the median nerve proximally I was then  able to follow it distally and found that there was no additional transverse  carpal ligament or adhesion bands in the distal direction.     The wound was then irrigated with sterile normal saline.  I then injected 1  mL of Decadron into the  surgical site.  Skin incision was then closed using  3-0 nylon suture in horizontal mattress fashion.  Tourniquet was then  deflated for a total of 12 minutes and adequate hemostasis was maintained.  I  then applied sterile soft dressing in the right upper extremity.  The patient  was then awakened from anesthesia and transported to PACU in stable  condition.  She appeared to have tolerated the procedure well.     PROGNOSIS:  At this point she will be discharged home with a short course of  oral antibiotics as well as pain medication.  She can have immediate range of  motion of the right upper extremity, but is to avoid heavy lifting, pushing  or pulling.  I will see her back in the office in 2 weeks for suture removal  and continue to monitor her progress in the outpatient setting for resolution  of her symptoms.                                            Candelaria Celeste, DO     [id number]  DD: 12/21/2010 13:05   DT: 12/21/2010 14:43   Job #: [id number]  CC: Candelaria Celeste, DO

## 2010-12-21 NOTE — Progress Notes (Signed)
Arrived to PACU in supine position , awake oriented to unit ,c/o pain support provided, meds given per Karolee Stamps CRNA

## 2010-12-21 NOTE — Progress Notes (Signed)
Remains painful , support provided meds given

## 2010-12-21 NOTE — Progress Notes (Signed)
Resting, states feels better, turned and repositioned tolerated well

## 2010-12-21 NOTE — Brief Op Note (Signed)
Brief Postoperative Note    Jessica Krause  Date of Birth:  19-Jun-1985  [id number]    Pre-operative Diagnosis: R CTS    Post-operative Diagnosis: Same    Procedure: R CTR      Anesthesia: General    Surgeons/Assistants: Candelaria Celeste, DO    Estimated Blood Loss: Minimal    Complications: none    Specimens: were not obtained      Candelaria Celeste

## 2010-12-21 NOTE — Progress Notes (Signed)
1400 ambulated to bathroom,  voided qs.  1430 medicated for op-site pain. 1520 stated pain level down to 3 out of 10 & was tolerable. 1540 home instructions given, voiced understanding. Discharged via w/c to private auto home with sister.

## 2010-12-21 NOTE — H&P (Signed)
Subjective:    Patient ID: Jessica Krause is a 25 y.o. Caucasian female. She comes in today for a recheck of her bilateral hand numbness and tingling, right worse than left as well as follow-up after EMG of her bilateral upper extremities. She continues to complain of numbness and tingling in her hands. In particular her right hand is progressively worsening and continues to wake her up at night. She is now also getting to have muscle twitches in her left thumb which is similar to how her right hand symptoms started. She continues to deny fevers or chills and denies any recent injury to her hands.   Pain is currently rated at 4/10.   Chief Complaint    Patient presents with    .  Arm Pain      bilateral      Social History      Occupational History    .  Not on file.      Social History Main Topics    .  Smoking status:  Current Everyday Smoker    .  Smokeless tobacco:  Not on file    .  Alcohol Use:  Yes       social    .  Drug Use:  No    .  Sexually Active:       Review of Systems   Constitutional: Negative for fever and chills.   Musculoskeletal: Positive for joint pain. Negative for myalgias.   Neurological: Positive for tingling, sensory change and focal weakness.     Objective:    Physical Exam   Constitutional: She is oriented to person, place, and time. She appears well-developed and well-nourished.   Musculoskeletal: Normal range of motion. She exhibits tenderness. She exhibits no edema.   Right hand: She exhibits tenderness and disruption of two-point discrimination. She exhibits normal range of motion, no bony tenderness, normal capillary refill, no deformity, no laceration and no swelling. decreased sensation noted. Decreased sensation is present in the medial distribution. Decreased sensation is not present in the ulnar distribution and is not present in the radial distribution. Decreased strength noted. She exhibits thumb/finger opposition.   Left hand: She exhibits tenderness and disruption of  two-point discrimination. She exhibits normal range of motion, no bony tenderness, normal capillary refill, no deformity, no laceration and no swelling. decreased sensation noted. Decreased sensation is present in the medial distribution. Decreased sensation is not present in the ulnar distribution and is not present in the radial distribution. Decreased strength noted. She exhibits thumb/finger opposition.   Neurological: She is alert and oriented to person, place, and time. She displays no atrophy. A sensory deficit is present. She exhibits normal muscle tone.   Skin: Skin is warm, dry and intact. No abrasion, no bruising and no ecchymosis noted. No erythema.     EMG report of the bilateral upper extremities reviewed by me today in the office demonstrates moderate carpal tunnel syndrome on the right with very early carpal tunnel syndrome on the left   Assessment:      1.  Carpal tunnel syndrome      Carpal tunnel syndrome, right worse than left   Plan:    I discussed with her today her EMG findings.   Given her persistent and worsening symptoms I recommend surgical treatment.   I recommend beginning on the right side which is her more symptomatic side.   I discussed performing right carpal tunnel release with her today.   I explained risks,  benefits, possible complications of the procedure and answered all questions for the patient. The patient understands and consents to the procedure. We will schedule surgery at soonest convenience.   I explained postoperative rehabilitation.   Patient will follow up with their primary care physician prior to surgical treatment for preoperative clearance.   Continue weight-bearing as tolerated.   Continue range of motion exercises as instructed.   Ice and elevate as needed.   Tylenol or Motrin for pain.   I gave her a brace to wear on her left side.   Follow-up will be two weeks postop for suture removal at which point we will discuss proceeding with surgical treatment for her  left hand.

## 2010-12-24 MED FILL — PROPOFOL 10 MG/ML IV EMUL: 10 MG/ML | INTRAVENOUS | Qty: 40

## 2010-12-24 MED FILL — FENTANYL CITRATE 0.05 MG/ML IJ SOLN: 0.05 MG/ML | INTRAMUSCULAR | Qty: 2

## 2010-12-24 MED FILL — MIDAZOLAM HCL 2 MG/2ML IJ SOLN: 2 MG/ML | INTRAMUSCULAR | Qty: 2

## 2010-12-24 MED FILL — LIDOCAINE HCL (PF) 2 % IJ SOLN: 2 % | INTRAMUSCULAR | Qty: 10

## 2010-12-25 MED FILL — MIDAZOLAM HCL 2 MG/2ML IJ SOLN: 2 MG/ML | INTRAMUSCULAR | Qty: 2

## 2010-12-25 MED FILL — FENTANYL CITRATE 0.05 MG/ML IJ SOLN: 0.05 MG/ML | INTRAMUSCULAR | Qty: 2

## 2011-01-05 NOTE — Patient Instructions (Signed)
Patient received instructions regarding care of surgery site after suture removal.

## 2011-01-05 NOTE — Progress Notes (Signed)
Subjective:      Jessica Krause is here for 2 weeks postop followup after right carpal tunnel surgery. Pain is controlled without any medications.. The patient notes improvement in the following symptoms:numbness, pain, sensation.  The patient denies fever, wound drainage, increasing redness, pus, increasing pain, increasing swelling. Post op problems reported: none.  She states that the night after surgery she already noticed improvement in her numbness and tingling in overalls doing much better.  At this point she would like to proceed with surgical treatment for her left hand.    Pain is currently rated at  4/10.  .      Objective:       General :    alert, appears stated age and cooperative   Sutures:   Sutures in place and will be removed today.   Incision:  healing well, no significant drainage, no dehiscence, no significant erythema   Tenderness:  mild   Flexion ROM:  full range of motion   Extension ROM:  full range of motion   Effusion:  no       Assessment:      2 weeks Status post right carpal tunnel surgery. Doing well postoperatively.  Left carpal tunnel syndrome.      Plan:      Sutures removed today.  Begin local wound cares. Wound care discussed.  Range of motion and rehabilitation exercises discussed with the patient.  Full weight bearing.  In regards to her right hand I explained that she's progressing quite well.  In regards to her left hand given her persistent symptoms and the fact that she did so well with her right hand I recommend proceeding with surgical treatment.  I discussed performing left carpal tunnel release with her today.  I explained risks, benefits, possible complications of the procedure and answered all questions for the patient.  The patient understands and consents to the procedure.  We will schedule surgery at soonest convenience.  I explained postoperative rehabilitation.  Patient will follow up with their primary care physician prior to surgical treatment for preoperative  clearance.  Continue weight-bearing as tolerated.  Continue range of motion exercises as instructed.  Ice and elevate as needed.  Tylenol or Motrin for pain.  Follow-up will be two weeks postop for suture removal and range of motion recheck.

## 2011-01-13 MED ORDER — OXYCODONE-ACETAMINOPHEN 5-325 MG PO TABS
5-325 MG | ORAL_TABLET | Freq: Four times a day (QID) | ORAL | Status: DC | PRN
Start: 2011-01-13 — End: 2011-01-17

## 2011-01-13 MED ORDER — CLINDAMYCIN HCL 150 MG PO CAPS
150 MG | ORAL_CAPSULE | Freq: Four times a day (QID) | ORAL | Status: AC
Start: 2011-01-13 — End: 2011-01-16

## 2011-01-16 NOTE — Patient Instructions (Addendum)
Surgery:            01/18/11@ 1215  Surgery:                          Arrival time: 1045  Nothing to eat or drink after midnight unless instructed to take certain medications by the doctor or the nurse the am of surgery  Arrive at the front information desk -1st floor /SDS is on 2 south  Please leave money and all other valuables at home.  Wear comfortable clothing. If you wear contacts please bring a case.  No make up. You may be asked to remove rings or body piercing.  Please bring insurance cards and picture ID am of procedure.  Please bring any consent or paper work from your doctor.  If you become ill,such as a cold, sore throat or fever contact your doctor.  Please bathe or shower am of procedure.  Medications to take AM of procedure:     Any questions call SDS  [phone]               Arrival time:   Nothing to eat or drink after midnight unless instructed to take certain medications by the doctor or the nurse the am of surgery  Arrive at the front information desk -1st floor /SDS is on [address]  Please leave money and all other valuables at home.  Wear comfortable clothing. If you wear contacts please bring a case.  No make up. You may be asked to remove rings or body piercing.  Please bring insurance cards and picture ID am of procedure.  Please bring any consent or paper work from your doctor.  If you become ill,such as a cold, sore throat or fever contact your doctor.  Please bathe or shower am of procedure.  Medications to take AM of procedure:  Pt also instructed not to smoke after midnight per anesthesia request   Any questions call SDS  [phone]

## 2011-01-16 NOTE — Progress Notes (Signed)
Attempted phone assessment, message left

## 2011-01-18 MED ORDER — LIDOCAINE HCL 1 % IJ SOLN
1 | INTRAMUSCULAR | Status: AC
Start: 2011-01-18 — End: ?

## 2011-01-18 MED ORDER — LACTATED RINGERS IV SOLN
INTRAVENOUS | Status: DC
Start: 2011-01-18 — End: 2011-01-18
  Administered 2011-01-18: 17:00:00 via INTRAVENOUS

## 2011-01-18 MED ORDER — ACETAMINOPHEN 325 MG PO TABS
325 MG | ORAL | Status: DC | PRN
Start: 2011-01-18 — End: 2011-01-18

## 2011-01-18 MED ORDER — DIPHENHYDRAMINE HCL 50 MG/ML IJ SOLN
50 MG/ML | Freq: Once | INTRAMUSCULAR | Status: DC | PRN
Start: 2011-01-18 — End: 2011-01-18

## 2011-01-18 MED ORDER — FENTANYL CITRATE 0.05 MG/ML IJ SOLN
0.05 MG/ML | INTRAMUSCULAR | Status: DC | PRN
Start: 2011-01-18 — End: 2011-01-18

## 2011-01-18 MED ORDER — SODIUM CHLORIDE 0.9 % IV SOLN
0.9 | INTRAVENOUS | Status: AC
Start: 2011-01-18 — End: ?

## 2011-01-18 MED ORDER — ONDANSETRON HCL 4 MG/2ML IJ SOLN
4 MG/2ML | Freq: Four times a day (QID) | INTRAMUSCULAR | Status: DC | PRN
Start: 2011-01-18 — End: 2011-01-18

## 2011-01-18 MED ORDER — MORPHINE SULFATE 10 MG/ML IJ SOLN
10 MG/ML | INTRAMUSCULAR | Status: DC | PRN
Start: 2011-01-18 — End: 2011-01-18

## 2011-01-18 MED ORDER — NORMAL SALINE FLUSH 0.9 % IV SOLN
0.9 % | INTRAVENOUS | Status: DC | PRN
Start: 2011-01-18 — End: 2011-01-18

## 2011-01-18 MED ORDER — NORMAL SALINE FLUSH 0.9 % IV SOLN
0.9 % | Freq: Two times a day (BID) | INTRAVENOUS | Status: DC
Start: 2011-01-18 — End: 2011-01-18

## 2011-01-18 MED ORDER — SODIUM CHLORIDE 0.9 % IV SOLN
0.9 % | INTRAVENOUS | Status: DC
Start: 2011-01-18 — End: 2011-01-18

## 2011-01-18 MED ORDER — DEXAMETHASONE SODIUM PHOSPHATE 4 MG/ML IJ SOLN
4 | INTRAMUSCULAR | Status: AC
Start: 2011-01-18 — End: ?

## 2011-01-18 MED ORDER — ONDANSETRON HCL 4 MG/2ML IJ SOLN
4 MG/2ML | Freq: Once | INTRAMUSCULAR | Status: DC | PRN
Start: 2011-01-18 — End: 2011-01-18

## 2011-01-18 MED ORDER — MEPERIDINE HCL 25 MG/ML IJ SOLN
25 MG/ML | INTRAMUSCULAR | Status: DC | PRN
Start: 2011-01-18 — End: 2011-01-18

## 2011-01-18 MED ORDER — CLINDAMYCIN PHOSPHATE 600 MG/4ML IV SOLN
600 MG/4ML | INTRAVENOUS | Status: AC
Start: 2011-01-18 — End: 2011-01-18
  Administered 2011-01-18: 18:00:00 via INTRAVENOUS

## 2011-01-18 MED ORDER — PROMETHAZINE HCL 25 MG/ML IJ SOLN
25 MG/ML | INTRAMUSCULAR | Status: DC | PRN
Start: 2011-01-18 — End: 2011-01-18
  Administered 2011-01-18: 19:00:00 via INTRAVENOUS

## 2011-01-18 MED ORDER — OXYCODONE-ACETAMINOPHEN 5-325 MG PO TABS
5-325 MG | ORAL | Status: DC | PRN
Start: 2011-01-18 — End: 2011-01-18
  Administered 2011-01-18: 20:00:00 1 via ORAL

## 2011-01-18 MED ORDER — OXYCODONE-ACETAMINOPHEN 5-325 MG PO TABS
5-325 MG | ORAL | Status: DC | PRN
Start: 2011-01-18 — End: 2011-01-18

## 2011-01-18 MED ORDER — HYDROMORPHONE HCL 1 MG/ML IJ SOLN
1 MG/ML | INTRAMUSCULAR | Status: DC | PRN
Start: 2011-01-18 — End: 2011-01-18
  Administered 2011-01-18 (×2): 0.5 mg via INTRAVENOUS

## 2011-01-18 MED ORDER — HYDRALAZINE HCL 20 MG/ML IJ SOLN
20 MG/ML | INTRAMUSCULAR | Status: DC | PRN
Start: 2011-01-18 — End: 2011-01-18

## 2011-01-18 MED ORDER — HYDROMORPHONE HCL 1 MG/ML IJ SOLN
1 MG/ML | INTRAMUSCULAR | Status: DC | PRN
Start: 2011-01-18 — End: 2011-01-18

## 2011-01-18 MED FILL — CLEOCIN PHOSPHATE 600 MG/4ML IV SOLN: 600 MG/4ML | INTRAVENOUS | Qty: 4

## 2011-01-18 MED FILL — OXYCODONE-ACETAMINOPHEN 5-325 MG PO TABS: 5-325 MG | ORAL | Qty: 2

## 2011-01-18 MED FILL — PROMETHAZINE HCL 25 MG/ML IJ SOLN: 25 MG/ML | INTRAMUSCULAR | Qty: 1

## 2011-01-18 MED FILL — LIDOCAINE HCL 1 % IJ SOLN: 1 % | INTRAMUSCULAR | Qty: 20

## 2011-01-18 MED FILL — LACTATED RINGERS IV SOLN: INTRAVENOUS | Qty: 1000

## 2011-01-18 MED FILL — DEXAMETHASONE SODIUM PHOSPHATE 4 MG/ML IJ SOLN: 4 MG/ML | INTRAMUSCULAR | Qty: 1

## 2011-01-18 MED FILL — SODIUM CHLORIDE 0.9 % IV SOLN: 0.9 % | INTRAVENOUS | Qty: 1000

## 2011-01-18 NOTE — Progress Notes (Signed)
Ambulated to bathroom, void qs and returned to bed. Gait steady.

## 2011-01-18 NOTE — Progress Notes (Signed)
Discharged per w/c per PCT, condition as documented.

## 2011-01-18 NOTE — Op Note (Signed)
PATIENT NAME:                 PA #:            MR #Jessica Krause, [facility]                 [id number]       [id number]            SURGEON:                              SURG DATE:  DIS DATE:          Candelaria Celeste, DO                   01/18/2011                     DATE OF BIRTH:   AGE:           PATIENT TYPE:     RM #:              [birth year]       25             OST               TIS                   PREOPERATIVE DIAGNOSIS:  Left carpal tunnel syndrome.       POSTOPERATIVE DIAGNOSIS:  Left carpal tunnel syndrome.       PROCEDURE:  Left carpal tunnel release.      ATTENDING SURGEON:  Dr. Candelaria Celeste      FIRST ASSISTANT:  Lanae Boast, PA.      ANESTHESIA:  General.      ESTIMATED BLOOD LOSS:  1 mL.      TOTAL TOURNIQUET TIME:  7 minutes.      FLUIDS:  300 mL of crystalloid.      INDICATIONS FOR PROCEDURE:  The patient is a 25 year old female with history  of numbness, tingling, and weakness in the left upper extremity.  She had  persistent symptoms despite conservative treatment.  An EMG was subsequently  obtained which showed evidence of carpal tunnel syndrome in the left upper  extremity.  Given her failure with conservative treatment and with her EMG  findings, I recommended surgical treatment.  I explained the risks, benefits,  and possible complications of the procedure to the patient and, after  answering all of her questions, she consented to undergo the above procedure.        REPORT OF PROCEDURE:  The patient was seen and evaluated in the preoperative  holding area, where the left upper extremity was signed in her presence.  At  this point, care of the patient was turned over to the anesthesia team, who  transported her back to the operative suite.  She was placed supine on the  operating table with the left upper extremity on an armboard.  General  anesthesia was applied and, once adequate anesthesia was obtained, the left  upper extremity was prepped and draped in the usual sterile  fashion.   Preoperative antibiotics were administered.  At this point, a time out was  performed and all in attendance were in agreement.      I exsanguinated the left upper extremity  with use of an Esmarch and  tourniquet was inflated to 250 mmHg.  I then marked out the planned surgical  incision beginning distally at the intersection of Kaplan cardinal line and  the radial border of the ring finger and extending proximally for about 1.5  cm.  I then made incision in this location and carried sharp dissection down  to the level of the transverse carpal ligament.  I then placed a Weitlaner  retractor within the incision for further exposure.  Once I had visualization  on the transverse carpal ligament, I incised the transverse carpal ligament  in longitudinal fashion with use of a scalpel.  I then placed a Ragnell  retractor at the distal extent of the incision and had my assistant hold this  in place.  I then bluntly dissected superficially and deep to the transverse  carpal ligament with use of scissors and then positioned the tip of the  scissors on the distal aspect of the transverse carpal ligament.  I then  applied gentle pressure distally, incising the transverse carpal ligament.  I  then bluntly probed with the tip of the scissors to ensure that complete  release had been obtained.  I then placed the Ragnell retractor at the  proximal extent of the incision and had my assistant hold this in place.  I  then bluntly resected superficial and deep to the transverse carpal ligament  with use of scissors.  I then positioned the tip of the scissors on the  proximal aspect of the transverse carpal ligament and applied gentle pressure  in a proximal direction, incising the transverse carpal ligament in that  direction.  I then bluntly probed to ensure that complete release had been  obtained.  I then irrigated the wound with sterile normal saline.  I then  injected 1 mL of Decadron into the surgical site.  Skin  incision was then  closed using 3-0 nylon suture in horizontal mattress fashion.  Tourniquet was  then deflated for a total of 7 minutes and adequate hemostasis was  maintained.  I then applied a sterile soft dressing to left upper extremity.   The patient was then awakened from anesthesia and transported to PACU in  stable condition.  She appeared to have tolerated the procedure well.      PROGNOSIS:  At this point, she will be discharged to home with a short course  of oral antibiotics as well as pain medication.  She can have immediate  weightbearing as tolerated and range of motion as tolerated with the left  upper extremity.  She is to avoid heavy lifting, pushing, or pulling.  I will  see her back in the office in 2 weeks for suture removal and continue to  monitor her progress in the outpatient setting for resolution of her  symptoms.                                              Candelaria Celeste, DO     [id number]  DD: 01/18/2011 13:40   DT: 01/18/2011 14:28   Job #: [id number]  CC: Candelaria Celeste, DO

## 2011-01-18 NOTE — Anesthesia Post-Procedure Evaluation (Signed)
Anesthesia Post-op Note    Patient: Jessica Krause    Procedure(s) Performed: L CTR    Anesthesia type: General    Patient location: PACU    Post-op pain: Adequate analgesia    Post-op assessment: no apparent anesthetic complications, tolerated procedure well and no evidence of recall    Last Vitals:   Filed Vitals:    01/18/11 1512   BP: 98/64   Pulse:    Temp:    Resp:      Post-op vital signs: stable    Level of consciousness: awake, alert  and oriented    Complications: none    Halle Davlin  3:23 PM

## 2011-01-18 NOTE — Progress Notes (Signed)
Medicated with 1 percocet po for c/o pain 5-6/10. Dr Gareth Morgan here to see. Family at bedside.

## 2011-01-18 NOTE — Progress Notes (Signed)
Urge to void , placed on bedpan

## 2011-01-18 NOTE — Progress Notes (Signed)
Resting no distress

## 2011-01-18 NOTE — H&P (Signed)
Subjective:    Jessica Krause is here for 2 weeks postop followup after right carpal tunnel surgery. Pain is controlled without any medications.. The patient notes improvement in the following symptoms:numbness, pain, sensation.   The patient denies fever, wound drainage, increasing redness, pus, increasing pain, increasing swelling. Post op problems reported: none. She states that the night after surgery she already noticed improvement in her numbness and tingling in overalls doing much better. At this point she would like to proceed with surgical treatment for her left hand.   Pain is currently rated at 4/10.     Past Medical History   Diagnosis Date   . Asthma    . Migraine    . Arthritis    . IBS (irritable bowel syndrome)      "was dx prior to gallbladder surgery but no trouble since the gb removed"   . Bipolar 1 disorder    . Anesthesia      "I wake up freaking out after surgery"     Current Facility-Administered Medications   Medication Dose Route Frequency Provider Last Rate Last Dose   . lactated ringers infusion   Intravenous Continuous Candelaria Celeste, DO 125 mL/hr at 01/18/11 1140     . sodium chloride 0.9 % injection 10 mL  10 mL Intravenous Q12H [facility] Candelaria Celeste, DO       . sodium chloride 0.9 % injection 10 mL  10 mL Intravenous PRN Candelaria Celeste, DO       . clindamycin (CLEOCIN) 600mg  IVPB 50ml add-vantage D5W  600 mg Intravenous On Call to OR Candelaria Celeste, DO       . fentaNYL (SUBLIMAZE) injection 25 mcg  25 mcg Intravenous Q5 Min PRN Naaman Plummer, MD       . HYDROmorphone (DILAUDID) injection 0.25 mg  0.25 mg Intravenous Q5 Min PRN Naaman Plummer, MD       . oxyCODONE-acetaminophen (PERCOCET) 5-325 MG per tablet 1 tablet  1 tablet Oral Q4H PRN Naaman Plummer, MD       . morphine injection 2 mg  2 mg Intravenous Q5 Min PRN Naaman Plummer, MD       . HYDROmorphone (DILAUDID) injection 0.5 mg  0.5 mg Intravenous Q5 Min PRN Naaman Plummer, MD       . diphenhydrAMINE (BENADRYL) injection 12.5 mg   12.5 mg Intravenous Once PRN Naaman Plummer, MD       . ondansetron (ZOFRAN) injection 4 mg  4 mg Intravenous Once PRN Naaman Plummer, MD       . promethazine (PHENERGAN) injection 6.25 mg  6.25 mg Intravenous Q5 Min PRN Naaman Plummer, MD       . hydrALAZINE (APRESOLINE) injection 5 mg  5 mg Intravenous Q10 Min PRN Naaman Plummer, MD       . meperidine (DEMEROL) injection 12.5 mg  12.5 mg Intravenous Q5 Min PRN Naaman Plummer, MD         Allergies   Allergen Reactions   . Inderal (Propranolol Hcl) Anaphylaxis     Heart rate dropped, couldn't walk   . Keflex Diarrhea   . Pcn (Penicillins)    . Amoxicillin Rash     "bloody stool"     Past Surgical History   Procedure Date   . Anterior cruciate ligament repair      right knee 2010(pc)   . Cholecystectomy age 61-2004   . Hysterectomy age 26     "removed everything but the ovaries and had to repair my  bladder"   . Dental surgery      "wisdom teeth removed age 69, put to sleep"   . Carpal tunnel release 12/21/10     right hand     History     Social History   . Marital Status: Married     Spouse Name: N/A     Number of Children: N/A   . Years of Education: N/A     Occupational History   . Not on file.     Social History Main Topics   . Smoking status: Current Everyday Smoker -- 0.5 packs/day for 10 years   . Smokeless tobacco: Never Used   . Alcohol Use: Yes      social, quit over a year ago   . Drug Use: No   . Sexually Active:      Other Topics Concern   . Not on file     Social History Narrative   . No narrative on file     .   Objective:      General :  alert, appears stated age and cooperative    Sutures:  Sutures in place and will be removed today.    Incision:  healing well, no significant drainage, no dehiscence, no significant erythema    Tenderness:  mild    Flexion ROM:  full range of motion    Extension ROM:  full range of motion    Effusion:  no      Assessment:    2 weeks Status post right carpal tunnel surgery. Doing well postoperatively.   Left carpal tunnel  syndrome.   Plan:    Sutures removed today.   Begin local wound cares. Wound care discussed.   Range of motion and rehabilitation exercises discussed with the patient.   Full weight bearing.   In regards to her right hand I explained that she's progressing quite well.   In regards to her left hand given her persistent symptoms and the fact that she did so well with her right hand I recommend proceeding with surgical treatment.   I discussed performing left carpal tunnel release with her today.   I explained risks, benefits, possible complications of the procedure and answered all questions for the patient. The patient understands and consents to the procedure. We will schedule surgery at soonest convenience.   I explained postoperative rehabilitation.   Patient will follow up with their primary care physician prior to surgical treatment for preoperative clearance.   Continue weight-bearing as tolerated.   Continue range of motion exercises as instructed.   Ice and elevate as needed.   Tylenol or Motrin for pain.   Follow-up will be two weeks postop for suture removal and range of motion recheck.     Pt seen and examined, No change in H+P.

## 2011-01-18 NOTE — Progress Notes (Signed)
Arrived to PACU in supine position , oriented to unit, monitors applied, report received from M. Warnament CRNA

## 2011-01-18 NOTE — Discharge Instructions (Signed)
[facility]  [phone]    Do not drive, work around machines or use equipment.  Do not drink any alcoholic beverages.  Do not smoke while alone.  Avoid making important decisions.  Plan to spend a quiet, relaxed evening @ home.  Resume normal activities as you begin to feel better.  Eat lightly for your first meal, then gradually increase your diet to what is normal for you.  In case of nausea, avoid food and drink only clear liquids.  Resume food as nausea ceases.  Notify your surgeon if you experience fever, chills, large amount of bleeding, difficulty breathing, persistent nausea and vomiting or any other disturbing problem.  Call for a follow-up appointment with your surgeon.       Follow Dr Eual Fines instructions in [facility]  [phone]               Oxycodone and Acetaminophen   En Espaol (Spanish Version)    Last modified: 03/11/2009    The following information is an educational aid only. It is not intended as medical advice for individual conditions or treatments. Talk to your doctor, nurse or pharmacist before following any medical regimen to see if it is safe and effective for you.    Pronunciation    (oks i KOE done & a seet a MIN oh fen)    Pharmacologic Category    Analgesic, Opioid    U.S[place] Brand Names    Endocet, Percocet, Primlev, Roxicet, Roxicet 5/500, Tylox    [place] Brand Names    Endocet, Novo-Oxycodone Acet, Oxycocet, Percocet, Percocet-Demi, PMS-Oxycodone-Acetaminophen    Reasons not to take this medicine     If you have an allergy to oxycodone, acetaminophen, or any other part of this medicine.       Tell healthcare provider if you are allergic to any medicine. Make sure to tell about the allergy and how it affected you. This includes telling about rash; hives; itching; shortness of breath; wheezing; cough; swelling of face, lips, tongue, or throat; or any other symptoms involved.       If you have any  of the following conditions: Kidney disease, liver disease, or lung disease.    What is this medicine used for?    This medicine is used to relieve pain.    How does it work?     Oxycodone binds to brain receptors, relieving pain. It decreases the feeling of pain and a person's response to pain.       Acetaminophen blocks production and release of chemicals that cause pain.    How is it best taken?     Take this medicine with or without food. Take with food if it causes an upset stomach.       Drink plenty of noncaffeine-containing liquid unless told to drink less liquid by healthcare provider.       A liquid (solution) is available if you cannot swallow pills.       Those who have feeding tubes can also use the liquid. Flush the feeding tube before and after medicine is given.    What do I do if I miss a dose? (does not apply to patients in the hospital)     Take a missed dose as soon as possible.       If it is almost time for the next dose, skip the missed dose and return to  your regular schedule.       Many times this medicine is taken on an as needed basis.    What are the precautions when taking this medicine?     This medicine may be habit-forming with long-term use.       Avoid other sources of acetaminophen. An overdose may cause dangerous problems.       Do not take more than 10 teaspoonfuls or 10 tablets in 24 hours.       If you are 27 or older, use this medicine with caution. You could have more side effects.       Check medicines with healthcare provider. This medicine may not mix well with other medicines.       You may not be alert. Avoid driving, doing other tasks or activities until you see how this medicine affects you.       Avoid medicines and natural products that slow your actions and reactions. These include sedatives, tranquilizers, mood stabilizers, antihistamines, and other pain medicine.       Avoid or limit alcohol intake (includes wine, beer, and liquor) to less than 3  drinks a day. Drinking too much alcohol may increase the risk of liver disease.       Be careful if you have G6PD deficiency. Anemia may occur.       Tell healthcare provider if you are pregnant or plan on getting pregnant.       Tell healthcare provider if you are breast-feeding.    What are some possible side effects of this medicine?     Feeling dizzy. Rise slowly over several minutes from sitting or lying position. Be careful climbing.       Feeling lightheaded, sleepy, having blurred vision, or a change in thinking clearly. Avoid driving, doing other tasks or activities that require you to be alert or have clear vision until you see how this medicine affects you.       Nausea or vomiting. Small frequent meals, frequent mouth care, sucking hard, sugar-free candy, or chewing sugar-free gum may help.       Constipation. More liquids, regular exercise, or a fiber-containing diet may help. Talk with healthcare provider about a stool softener or laxative.       Liver damage can rarely occur.    What should I monitor?     Change in condition being treated. Is it better, worse, or about the same?       Keep a diary of pain control.       Bowel movements.    Reasons to call healthcare provider immediately     If you suspect an overdose, call your local poison control center or emergency department immediately.       Signs of a life-threatening reaction. These include wheezing; chest tightness; fever; itching; bad cough; blue skin color; fits; or swelling of face, lips, tongue, or throat.       Severe dizziness or passing out.       Difficulty breathing.       Significant change in thinking clearly and logically.       Poor pain control.       Severe nausea or vomiting.       Severe constipation.       Feeling extremely tired or weak.       Yellow skin or eyes.       Not able to eat.       Any rash.  No improvement in condition or feeling worse.    How should I store this medicine?     Store  at room temperature.       Protect from light.       Protect caplets, capsules, and tablets from moisture. Do not store in a bathroom or kitchen.       Throw away any unused medicine by flushing down toilet or sink.    General statements     If you have a life-threatening allergy, wear allergy identification at all times.       Do not share your medicine with others and do not take anyone else's medicine.       Keep all medicine out of the reach of children and pets.       Keep a list of all your medicines (prescription, natural products, supplements, vitamins, over-the-counter) with you. Give this list to healthcare provider (doctor, nurse, nurse practitioner, pharmacist, physician assistant).       In [place] return any unused drugs back to the pharmacy. Also, visit http://www.hc-sc.gc.ca/hl-vs/iyh-vsv/med/disposal-defaire-eng.php#th for more facts about the right way to get rid of unused drugs.       Call your doctor for medical advice about side effects. You may report side effects to FDA at 1-800-FDA-1088 or in [place] to [employer]'s [place] Vigilance Program at [phone].       Talk with healthcare provider before starting any new medicine, including over-the-counter, natural products, or vitamins.    Please be aware that this information is provided to supplement the care provided by your physician. It is neither intended nor implied to be a substitute for professional medical advice. CALL YOUR HEALTHCARE PROVIDER IMMEDIATELY IF YOU THINK YOU MAY HAVE A MEDICAL EMERGENCY. Always seek the advice of your physician or other qualified health provider prior to starting any new treatment or with any questions you may have regarding a medical condition.    Last modified: 03/11/2009      Clindamycin   En Espaol (Spanish Version)    Last modified: 04/23/2009    The following information is an educational aid only. It is not intended as medical advice for individual conditions or treatments. Talk to  your doctor, nurse or pharmacist before following any medical regimen to see if it is safe and effective for you.    Pronunciation    (klin da MYE sin)    Pharmacologic Category    Antibiotic, Lincosamide    Topical Skin Product, Acne    U.S. Brand Names    Cleocin HCl, Cleocin Pediatric, Cleocin Phosphate, Cleocin T, Cleocin, Cleocin Vaginal Ovule, Clindagel, ClindaMax, ClindaReach, Clindesse, Evoclin    [employer] Names    Apo-Clindamycin, Clinda-T, Clindamycin Injection, USP, Clindamycine, Clindasol, Clindets, Clindoxyl, Dalacin C, Dalacin T, Dalacin Vaginal, Mylan-Clindamycin, Novo-Clindamycin, PMS-Clindamycin, ratio-Clindamycin, Riva-Clindamycin, Taro-Clindamycin    [place] Brand Names    Biodaclin, Clidets, Clinamsa, Cutaclin 1%, Dalacin C, Galecin, Klamoxyl, Klin-Amsa, Lisiken, Trexen    What key warnings should I know about before taking this medicine?     This medicine can cause severe diarrhea. Talk with healthcare provider.       This medicine does not mix well with some medicines. Serious reactions may occur. Check all medicines with healthcare provider.    Reasons not to take this medicine     If you have an allergy to clindamycin or any other part of this medicine.       Tell healthcare provider if you are allergic to any medicine. Make  sure to tell about the allergy and how it affected you. This includes telling about rash; hives; itching; shortness of breath; wheezing; cough; swelling of face, lips, tongue, or throat; or any other symptoms involved.       If you have any of the following conditions: Liver disease or severe diarrhea called pseudomembranous colitis.    What is this medicine used for?    This medicine is used to prevent or treat a variety of bacterial infections.    How does it work?    Clindamycin works to injure the bacteria and fight the infection.    How is it best taken?    All forms:     To gain the most benefit, do not miss doses.       Use prescription as directed,  even if feeling better.    Oral:     Take this medicine with or without food. Take with food if it causes an upset stomach.       Take this medicine with a full glass of water.       Do not lie down for at least 30 minutes after taking this medicine. This prevents irritation to the esophagus (swallowing tube).    Skin:     Do not take this medicine by mouth. For skin only. Keep out of mouth, nose, and eyes (may burn).       Shake lotion well before use.       Remove pledget from foil. Use once and throw away.       Wash hands before and after use.       Clean affected area before use. Make sure to dry well.       Apply a thin layer to the affected skin and rub in gently.       Apply foam onto cool surface or into cap. Do not place foam directly in hands.    Vaginal:     Use cream vaginally.       Keep out of mouth, nose, and eyes (may burn).       Use at bedtime.       Do not have sexual intercourse while using this medicine.    Injection:    This medicine is given as a shot into a muscle or vein.    What do I do if I miss a dose? (does not apply to patients in the hospital)     Use a missed dose as soon as possible.       If it is almost time for the next dose, skip the missed dose and return to your regular schedule.       Do not use a double dose or extra doses.    What are the precautions when taking this medicine?    All forms:     If you are allergic to tartrazine, talk with healthcare provider. Some products contain tartrazine.       Check medicines with healthcare provider. This medicine may not mix well with other medicines.       Tell healthcare provider if you are pregnant or plan on getting pregnant.       Condoms or diaphragms may not work to prevent pregnancy. Use another form of birth control while taking this medicine and for 72 hours after treatment ends.       Tell healthcare provider if you are breast-feeding.    Skin:    Do not put coverings (bandages, dressings,  make-up)  over the area unless told to do so by healthcare provider.    What are some possible side effects of this medicine?     Belly pain.       Nausea or vomiting. Small frequent meals, frequent mouth care, sucking hard, sugar-free candy, or chewing sugar-free gum may help.       Diarrhea. Yogurt, Bifidobacterium bifidum, or Lactobacillus acidophilus may help. These products are available at health food stores or in some pharmacies.       Skin irritation.       For females, vaginal yeast infection. Report itching or discharge.    What should I monitor?     Change in condition being treated. Is it better, worse, or about the same?       Take good care of your teeth. See a dentist regularly.       Follow up with healthcare provider.    Reasons to call healthcare provider immediately     If you suspect an overdose, call your local poison control center or emergency department immediately.       Signs of a life-threatening reaction. These include wheezing; chest tightness; fever; itching; bad cough; blue skin color; fits; or swelling of face, lips, tongue, or throat.       Severe diarrhea, even after medicine is stopped.       Severe skin irritation.       Any rash.       No improvement in condition or feeling worse.    How should I store this medicine?     Store at room temperature.       Protect tablets from moisture. Do not store in a bathroom or kitchen.       Protect foam from heat.       Follow directions for storage of injection. Talk with healthcare provider.    General statements     If you have a life-threatening allergy, wear allergy identification at all times.       Do not share your medicine with others and do not take anyone else's medicine.       Keep all medicine out of the reach of children and pets.       Most medicines can be thrown away in household trash after mixing with coffee grounds or kitty litter and sealing in a plastic bag.       In [place] return any unused drugs back to  the pharmacy. Also, visit http://www.hc-sc.gc.ca/hl-vs/iyh-vsv/med/disposal-defaire-eng.php#th for more facts about the right way to get rid of unused drugs.       Keep a list of all your medicines (prescription, natural products, supplements, vitamins, over-the-counter) with you. Give this list to healthcare provider (doctor, nurse, nurse practitioner, pharmacist, physician assistant).       Call your doctor for medical advice about side effects. You may report side effects to FDA at 1-800-FDA-1088 or in [place] to [employer]'s [place] Vigilance Program at [phone].       Talk with healthcare provider before starting any new medicine, including over-the-counter, natural products, or vitamins.    Please be aware that this information is provided to supplement the care provided by your physician. It is neither intended nor implied to be a substitute for professional medical advice. CALL YOUR HEALTHCARE PROVIDER IMMEDIATELY IF YOU THINK YOU MAY HAVE A MEDICAL EMERGENCY. Always seek the advice of your physician or other qualified health provider prior to starting any new treatment or with any questions  you may have regarding a medical condition.    Last modified: 04/23/2009

## 2011-01-18 NOTE — Brief Op Note (Signed)
Brief Postoperative Note    Jessica Krause  Date of Birth:  06-10-1985  [id number]    Pre-operative Diagnosis: L CTS    Post-operative Diagnosis: Same    Procedure: L CTR    Anesthesia: General    Surgeons/Assistants: Candelaria Celeste, DO, Lanae Boast, PA-C    Estimated Blood Loss: Minimal    Complications: none    Specimens: were not obtained      Candelaria Celeste

## 2011-01-18 NOTE — Progress Notes (Signed)
Diagnosis- Knowledge deficit related to procedure as evidenced by patient asking questions.  Knowledge deficit related to pre-procedure routine.  Expected Outcome - Verbalizes understanding of procedure and necessary preparations.  Nursing Actions - Pre-operative teaching, including NPO and printed pre-procedure instruction sheet.  Verbalizes understanding of pre-procedure teaching.    Nursing Diagnosis - Potential for anxiety related to lack of knowledge of procedure.  Expected Outcome - Demonstrates decreased anxiety.  Nursing Actions - Give clear, concise explanations.  Keep other caregivers informed.  Convey caring , supportive attitude.    Nursing Diagnosis - Potential for injury.  Expected Outcome - Patient will remain injury free.  Nursing Actions - Complete patient assessment. Provide monitoring as indicated. Place nursing call light within reach.  Secure safety straps/siderails during procedure.  Provide printed post-procedure instructions to out-patients.    I have reviewed each nursing diagnosis, expected outcome and nursing actions.  All apply to this patient.  Each goal has been met on this date.

## 2011-01-18 NOTE — Progress Notes (Signed)
Returned to room, awake, alert.  C/O pain left wrist . Ace wrap dry and intact. Left arm elevated on pillow and iced. Wiggles fingers when asked, warm to touch and nailbeds blanch quickly. SCD hose on. IV infusing without difficulty. 7-up and graham crackers po.

## 2011-01-18 NOTE — Progress Notes (Signed)
Voided per bedpan, turned and repositioned tolerated well

## 2011-01-18 NOTE — Progress Notes (Signed)
Discharged to SDS per bed , handoff bedside report given to S. Westfall RN

## 2011-01-18 NOTE — Progress Notes (Signed)
States pain better, ready to go home. Vitals done. Dressing dry. Fingers warm, quick capillary refill, wiggles fingers when asked. Home instructions given, voiced understanding. See copy. Getting dressed, assist of Husband.

## 2011-01-18 NOTE — Anesthesia Pre-Procedure Evaluation (Signed)
[facility]     Anesthesia Evaluation     Patient summary reviewed and Nursing notes reviewed    No history of anesthetic complications   Airway   Mallampati: I  TM distance: >3 FB  Neck ROM: full  Dental - normal exam     Pulmonary    (+) asthma,   ROS comment: smoker  Cardiovascular - negative ROS  Exercise tolerance: good  Beta Blocker:  Not on Beta Blocker    Neuro/Psych    (+) headaches (migraines), psychiatric history (bipolar disorder)  (-) neuromuscular disease  GI/Hepatic/Renal      Comments: Irritable bowel syndrome    Endo/Other  (+) , arthritis  Abdominal   (+) obese,            Proposed Procedure: Lt CTR    Past Medical History   Diagnosis Date   ??? Asthma    ??? Migraine    ??? Arthritis    ??? IBS (irritable bowel syndrome)      "was dx prior to gallbladder surgery but no trouble since the gb removed"   ??? Bipolar 1 disorder    ??? Anesthesia      "I wake up freaking out after surgery"       Past Surgical History   Procedure Date   ??? Anterior cruciate ligament repair      right knee 2010(pc)   ??? Cholecystectomy age 82-2004   ??? Hysterectomy age 68     "removed everything but the ovaries and had to repair my bladder"   ??? Dental surgery      "wisdom teeth removed age 32, put to sleep"   ??? Carpal tunnel release 12/21/10     right hand        History     Social History   ??? Marital Status: Married     Spouse Name: N/A     Number of Children: N/A   ??? Years of Education: N/A     Occupational History   ??? Not on file.     Social History Main Topics   ??? Smoking status: Current Everyday Smoker -- 0.5 packs/day for 10 years   ??? Smokeless tobacco: Never Used   ??? Alcohol Use: Yes      social, quit over a year ago   ??? Drug Use: No   ??? Sexually Active:      Other Topics Concern   ??? Not on file     Social History Narrative   ??? No narrative on file        Present on Admission:   ???Carpal tunnel syndrome     Allergies   Allergen Reactions   ??? Inderal (Propranolol Hcl) Anaphylaxis     Heart rate dropped, couldn't walk   ??? Keflex  Diarrhea   ??? Pcn (Penicillins)    ??? Amoxicillin Rash     "bloody stool"       Prescriptions prior to admission:ibuprofen (ADVIL;MOTRIN) 200 MG CAPS, Take 4 capsules by mouth continuous prn.    Hospital Medications:    lactated ringers infusion Continuous   sodium chloride 0.9 % injection 10 mL Q12H SCH   sodium chloride 0.9 % injection 10 mL PRN   clindamycin (CLEOCIN) 600mg  IVPB 50ml add-vantage D5W On Call to OR       Recent Vitals:  Filed Vitals:    01/18/11 1049   BP: 126/80   Pulse: 85   Temp: 98.7 ??F (37.1 ??C)  Resp: 16     Wt Readings from Last 3 Encounters:   01/17/11 252 lb (114.306 kg)   12/20/10 252 lb (114.306 kg)   11/29/10 252 lb (114.306 kg)      Ht Readings from Last 3 Encounters:   01/17/11 5\' 8"  (1.727 m)   12/20/10 5\' 8"  (1.727 m)   11/29/10 5\' 8"  (1.727 m)     Body mass index is 38.32 kg/(m^2).      LABS:    CBC  Lab Results   Component Value Date/Time    WBC 9.7 11/24/2008 10:10 AM    HGB 14.9 11/24/2008 10:10 AM    HCT 43.4 11/24/2008 10:10 AM    PLT 230 11/24/2008 10:10 AM     RENAL  Lab Results   Component Value Date/Time    NA 137 11/24/2008 10:10 AM    K 4.0 11/24/2008 10:10 AM    CL 108 11/24/2008 10:10 AM    CO2 22 11/24/2008 10:10 AM    BUN 14 11/24/2008 10:10 AM    CREATININE 0.6 11/24/2008 10:10 AM     COAGS  No results found for this basename: protime,  inr,  aptt                   Allergies: Inderal; Keflex; Pcn; and Amoxicillin    NPO Status: Time of last liquid consumption: 2330                       Time of last solid food consumption: 2330    Anesthesia Plan    ASA 2     general and regional   (Discussed the possibility of sore throat, eye irritations, pain, nausea and vomiting, and shivering. Also mentioned the rare possibilities of heart rate and blood pressure as well as respiratory problems as part of many rare problems that could come up.   Also explained that we do our best to avoid any complication and to treat them if needed.    Pt Prefers GA  )  intravenous induction    Anesthetic plan and risks discussed with patient.    Plan discussed with CRNA.          Che Below  01/18/2011

## 2011-01-20 MED FILL — FENTANYL CITRATE 0.05 MG/ML IJ SOLN: 0.05 MG/ML | INTRAMUSCULAR | Qty: 2

## 2011-01-20 MED FILL — HYDROMORPHONE HCL 1 MG/ML IJ SOLN: 1 MG/ML | INTRAMUSCULAR | Qty: 1

## 2011-01-20 MED FILL — MIDAZOLAM HCL 2 MG/2ML IJ SOLN: 2 MG/ML | INTRAMUSCULAR | Qty: 2

## 2011-01-21 MED FILL — LIDOCAINE HCL (PF) 2 % IJ SOLN: 2 % | INTRAMUSCULAR | Qty: 10

## 2011-01-21 MED FILL — ONDANSETRON HCL 4 MG/2ML IJ SOLN: 4 MG/2ML | INTRAMUSCULAR | Qty: 2

## 2011-01-30 MED ORDER — HYDROCODONE-ACETAMINOPHEN 5-325 MG PO TABS
5-325 MG | ORAL_TABLET | Freq: Four times a day (QID) | ORAL | Status: AC | PRN
Start: 2011-01-30 — End: 2011-03-01

## 2011-01-30 NOTE — Patient Instructions (Signed)
Patient care instructions given.

## 2011-01-30 NOTE — Progress Notes (Signed)
Subjective:      Jessica Krause is here for 2 weeks postop followup after left carpal tunnel surgery. Pain is controlled with medications.. The patient notes improvement in the following symptoms:numbness, pain, sensation.  The patient denies fever, wound drainage, increasing redness, pus, increasing pain, increasing swelling. Post op problems reported: none.  She states that the night after surgery she already noticed improvement in her numbness and tingling in overalls doing much better.  At this point she would like to proceed with surgical treatment for her left hand.    Pain is currently rated at  4/10.  .      Objective:       General :    alert, appears stated age and cooperative   Sutures:   Sutures in place and will be removed today.   Incision:  healing well, no significant drainage, no dehiscence, no significant erythema   Tenderness:  mild   Flexion ROM:  full range of motion   Extension ROM:  full range of motion   Effusion:  no       Assessment:      2 weeks Status post left carpal tunnel surgery. Doing well postoperatively.     Plan:      Sutures removed today.  Begin local wound cares. Wound care discussed.  Range of motion and rehabilitation exercises discussed with the patient.  Full weight bearing.  Continue weight-bearing as tolerated.  Continue range of motion exercises as instructed.  Ice and elevate as needed.  Tylenol or Motrin for pain.  Follow-up will be 4 weeks for range of motion recheck.

## 2011-10-28 ENCOUNTER — Inpatient Hospital Stay: Admit: 2011-10-28 | Discharge: 2011-10-28

## 2011-10-28 MED ORDER — IBUPROFEN 800 MG PO TABS
800 MG | ORAL_TABLET | Freq: Three times a day (TID) | ORAL | Status: DC | PRN
Start: 2011-10-28 — End: 2013-09-27

## 2011-10-28 MED ORDER — IBUPROFEN 800 MG PO TABS
800 MG | Freq: Once | ORAL | Status: AC
Start: 2011-10-28 — End: 2011-10-28
  Administered 2011-10-28: 17:00:00 via ORAL

## 2011-10-28 MED FILL — IBUPROFEN 800 MG PO TABS: 800 MG | ORAL | Qty: 1

## 2011-10-28 NOTE — Discharge Instructions (Signed)
Ankle Sprain  An ankle sprain is an injury to the strong, fibrous tissues (ligaments) that hold the bones of your ankle joint together.   CAUSES  An ankle sprain is usually caused by a fall or by twisting your ankle. Ankle sprains most commonly occur when you step on the outer edge of your foot, and your ankle turns inward. People who participate in sports are more prone to these types of injuries.   SYMPTOMS    Pain in your ankle. The pain may be present at rest or only when you are trying to stand or walk.   Swelling.   Bruising. Bruising may develop immediately or within 1 to 2 days after your injury.   Difficulty standing or walking, particularly when turning corners or changing directions.  DIAGNOSIS   Your caregiver will ask you details about your injury and perform a physical exam of your ankle to determine if you have an ankle sprain. During the physical exam, your caregiver will press on and apply pressure to specific areas of your foot and ankle. Your caregiver will try to move your ankle in certain ways. An X-ray exam may be done to be sure a bone was not broken or a ligament did not separate from one of the bones in your ankle (avulsion fracture).   TREATMENT   Certain types of braces can help stabilize your ankle. Your caregiver can make a recommendation for this. Your caregiver may recommend the use of medicine for pain. If your sprain is severe, your caregiver may refer you to a surgeon who helps to restore function to parts of your skeletal system (orthopedist) or a physical therapist.  [place] ice to your injury for 1 to 2 days or as directed by your caregiver. Applying ice helps to reduce inflammation and pain.   Put ice in a plastic bag.   Place a towel between your skin and the bag.   Leave the ice on for 15 to 20 minutes at a time, every 2 hours while you are awake.   Only take over-the-counter or prescription medicines for pain, discomfort, or fever as directed  by your caregiver.   Keep your injured leg elevated, when possible, to lessen swelling.   If your caregiver recommends crutches, use them as instructed. Gradually put weight on the affected ankle. Continue to use crutches or a cane until you can walk without feeling pain in your ankle.   If you have a plaster splint, wear the splint as directed by your caregiver. Do not rest it on anything harder than a pillow for the first 24 hours. Do not put weight on it. Do not get it wet. You may take it off to take a shower or bath.   You may have been given an elastic bandage to wear around your ankle to provide support. If the elastic bandage is too tight (you have numbness or tingling in your foot or your foot becomes cold and blue), adjust the bandage to make it comfortable.   If you have an air splint, you may blow more air into it or let air out to make it more comfortable. You may take your splint off at night and before taking a shower or bath.   Wiggle your toes in the splint several times per day to decrease swelling.  SEEK MEDICAL CARE IF:    You have an increase in bruising, swelling, or pain.   Your toes feel extremely cold  or you lose feeling in your foot.   Your pain is not relieved with medicine.  SEEK IMMEDIATE MEDICAL CARE IF:   Your toes are numb or blue.   You have severe pain.  MAKE SURE YOU:    Understand these instructions.   Will watch your condition.   Will get help right away if you are not doing well or get worse.  Document Released: 01/23/2005 Document Revised: 04/17/2011 Document Reviewed: 02/04/2011  [facility] Patient Information 2013 [place].

## 2011-10-28 NOTE — ED Provider Notes (Signed)
eMERGENCY dEPARTMENT eNCOUnter        CHIEF COMPLAINT    Chief Complaint   Patient presents with   ??? Ankle Pain       HPI    Jessica Krause is a 26 y.o. female who presents with pain localized in the leftankle. Onset was just prior to arrial. The context was she stepped in a hole and rolled her ankle. The pain severity is 3/10. The patient has pain in the hand as well, states she fell onto the left hand.  The pain is aggravated by ambulation.    REVIEW OF SYSTEMS    General: No fever or chills  Skin: No lacerations or puncture wounds  Musculoskeletal: Complains of ankle pain, no other joint pain    PAST MEDICAL & SURGICAL HISTORY    Past Medical History   Diagnosis Date   ??? Asthma    ??? Migraine    ??? Arthritis    ??? IBS (irritable bowel syndrome)      "was dx prior to gallbladder surgery but no trouble since the gb removed"   ??? Bipolar 1 disorder    ??? Anesthesia      "I wake up freaking out after surgery"     Past Surgical History   Procedure Laterality Date   ??? Anterior cruciate ligament repair       right knee 2010(pc)   ??? Cholecystectomy  age 59-2004   ??? Hysterectomy  age 1     "removed everything but the ovaries and had to repair my bladder"   ??? Dental surgery       "wisdom teeth removed age 63, put to sleep"   ??? Carpal tunnel release  12/21/10     right hand   ??? Carpal tunnel release  01/18/11     left carpal tunnel release        CURRENT MEDICATIONS    Current Outpatient Rx   Name  Route  Sig  Dispense  Refill   ??? ibuprofen (ADVIL;MOTRIN) 200 MG CAPS    Oral    Take 4 capsules by mouth continuous prn.                 ALLERGIES    Allergies   Allergen Reactions   ??? Inderal (Propranolol Hcl) Anaphylaxis     Heart rate dropped, couldn't walk   ??? Keflex Diarrhea   ??? Pcn (Penicillins)    ??? Amoxicillin Rash     "bloody stool"       SOCIAL & FAMILY HISTORY    History     Social History   ??? Marital Status: Married     Spouse Name: N/A     Number of Children: N/A   ??? Years of Education: N/A     Social History Main  Topics   ??? Smoking status: Current Every Day Smoker -- 0.50 packs/day for 10 years   ??? Smokeless tobacco: Never Used   ??? Alcohol Use: Yes      social, quit over a year ago   ??? Drug Use: No   ??? Sexually Active:      Other Topics Concern   ??? None     Social History Narrative   ??? None     Family History   Problem Relation Age of Onset   ??? Asthma Mother    ??? Mental Illness Mother    ??? Heart Disease Father      MI   ??? High  Blood Pressure Father    ??? Asthma Daughter          PHYSICAL EXAM    VITAL SIGNS: BP 114/84   Pulse 69   Temp(Src) 98 ??F (36.7 ??C) (Oral)   Resp 16   Ht 5\' 8"  (1.727 m)   Wt 255 lb (115.667 kg)   BMI 38.78 kg/m2   SpO2 100%   Breastfeeding? No  Constitutional:  Well developed, appears comfortable  HENT:  Atraumatic  Respiratory:  No retractions  Vascular: normal DP pulse 2+  Musculoskeletal: The leftlateral ankle demonstrates tenderness and soft tissue swelling just anterior and inferior to the lateral malleolus.  The same ankle joint is stable.  Full ROM of left hand no swelling or bruising noted on exam.  Integument:  No open wounds of the left ankle   Neurologic:  Awake alert, no slurred speech     RADIOLOGY   XRAY of theleft ankle read by the radiologist: Lateral soft tissue swelling. No fracture or disruption of the ankle mortise.       Left hand:No fracture or joint abnormality identified in the left hand.       PROCEDURES   SPLINT PLACEMENT  An air-case splint was applied to the left ankle by the emergency department technician. I evaluated the splint after it was applied.  The splint is in good position.  The patient's extremity is neurovascularly intact after the splint placement.    ED COURSE & MEDICAL DECISION MAKING    See EMR for medications prescribed  She was given 800mg  Ibuprofen in the ED and ice was applied to the ankle.  Filed Vitals:    10/28/11 1304   BP: 114/84   Pulse: 69   Temp: 98 ??F (36.7 ??C)   TempSrc: Oral   Resp: 16   Height: 5\' 8"  (1.727 m)   Weight: 255 lb (115.667 kg)    SpO2: 100%     Differential diagnosis includes but not limited to fracture, dislocation, vascular injury, neurologic injury.    FINAL IMPRESSION    Acute ankle sprain    PLAN  Outpatient followup, medications, and discharge instructions (see EMR)  Is given discharge instructions for ankle sprain.  She was prescribed Motrin 800 mg by mouth every 8 hours for 30 dose course.  She was advised she can take Tylenol along with Motrin . She was advised to ice and elevate.  She was advised to contact her primary care provider and make appointment for next week.  She was discharged in ED in good condition the company of a family member    (Please note that this note was completed with a voice recognition program.  Every attempt was made to edit the dictations, but inevitably there remain words that are mis-transcribed.)    Mckinley Jewel, CNP  10/28/11 1513

## 2011-10-28 NOTE — ED Notes (Signed)
Pt fell in a hole, heard L ankle pop per pt.  L ankle is swollen & painful c movement.    Lucrezia Europe, RN  10/28/11 1308

## 2011-12-27 ENCOUNTER — Inpatient Hospital Stay: Admit: 2011-12-27 | Discharge: 2011-12-28

## 2011-12-27 MED ORDER — IBUPROFEN 600 MG PO TABS
600 MG | Freq: Once | ORAL | Status: AC
Start: 2011-12-27 — End: 2011-12-27
  Administered 2011-12-27: 23:00:00 via ORAL

## 2011-12-27 MED ORDER — HYDROMORPHONE HCL 2 MG/ML IJ SOLN
2 MG/ML | Freq: Once | INTRAMUSCULAR | Status: AC
Start: 2011-12-27 — End: 2011-12-27
  Administered 2011-12-27: 23:00:00 via INTRAMUSCULAR

## 2011-12-27 MED ORDER — ONDANSETRON 4 MG PO TBDP
4 MG | Freq: Once | ORAL | Status: AC
Start: 2011-12-27 — End: 2011-12-27
  Administered 2011-12-27: 23:00:00 via ORAL

## 2011-12-27 MED FILL — HYDROMORPHONE HCL 2 MG/ML IJ SOLN: 2 MG/ML | INTRAMUSCULAR | Qty: 1

## 2011-12-27 MED FILL — IBUPROFEN 600 MG PO TABS: 600 MG | ORAL | Qty: 1

## 2011-12-27 MED FILL — ONDANSETRON 4 MG PO TBDP: 4 MG | ORAL | Qty: 1

## 2011-12-27 NOTE — ED Notes (Signed)
Pt returned from xray     [place], [place]  16/10/96 0454

## 2011-12-27 NOTE — ED Provider Notes (Signed)
eMERGENCY dEPARTMENT eNCOUnter        CHIEF COMPLAINT    Chief Complaint   Patient presents with   ??? Knee Injury     right   ??? Ankle Pain     left       HPI    NILOUFAR DWORSHAK is a 26 y.o. female who presents with right knee and left ankle pain. Onset was around 1630 after she tripped and fell on uneven sidewalk and inverted her left ankle and landed on her right knee.  The duration has been since the onset.  The quality of the pain is throbbing, burning, needle like, rates at 8/10.  No head injury, no other injuries.  No neuro complaints.  This occurred in front of her work.    REVIEW OF SYSTEMS    General: No fever or chills  Cardiac: No chest pain  Pulmonary: No shortness of breath  GI: No vomiting or diarrhea  GU: No dysuria or hematuria    PAST MEDICAL & SURGICAL HISTORY    Past Medical History   Diagnosis Date   ??? Asthma    ??? Migraine    ??? Arthritis    ??? IBS (irritable bowel syndrome)      "was dx prior to gallbladder surgery but no trouble since the gb removed"   ??? Bipolar 1 disorder    ??? Anesthesia      "I wake up freaking out after surgery"     Past Surgical History   Procedure Laterality Date   ??? Anterior cruciate ligament repair       right knee 2010(pc)   ??? Cholecystectomy  age 59-2004   ??? Hysterectomy  age 26     "removed everything but the ovaries and had to repair my bladder"   ??? Dental surgery       "wisdom teeth removed age 36, put to sleep"   ??? Carpal tunnel release  12/21/10     right hand   ??? Carpal tunnel release  01/18/11     left carpal tunnel release        CURRENT MEDICATIONS    Current Outpatient Rx   Name  Route  Sig  Dispense  Refill   ??? ibuprofen (ADVIL;MOTRIN) 200 MG CAPS    Oral    Take 4 capsules by mouth continuous prn.             ??? ibuprofen (IBU) 800 MG tablet    Oral    Take 1 tablet by mouth every 8 hours as needed for Pain.    30 tablet    0         ALLERGIES    Allergies   Allergen Reactions   ??? Inderal (Propranolol Hcl) Anaphylaxis     Heart rate dropped, couldn't walk   ???  Keflex Diarrhea   ??? Pcn (Penicillins)    ??? Amoxicillin Rash     "bloody stool"       SOCIAL & FAMILY HISTORY    History     Social History   ??? Marital Status: Married     Spouse Name: N/A     Number of Children: N/A   ??? Years of Education: N/A     Social History Main Topics   ??? Smoking status: Former Smoker -- 0.50 packs/day for 10 years     Quit date: 12/06/2011   ??? Smokeless tobacco: Never Used   ??? Alcohol Use: Yes  social, quit over a year ago   ??? Drug Use: No   ??? Sexually Active: Yes -- Female partner(s)     Other Topics Concern   ??? None     Social History Narrative   ??? None     Family History   Problem Relation Age of Onset   ??? Asthma Mother    ??? Mental Illness Mother    ??? Heart Disease Father      MI   ??? High Blood Pressure Father    ??? Asthma Daughter          PHYSICAL EXAM    VITAL SIGNS: BP 130/91   Pulse 91   Temp(Src) 98.9 ??F (37.2 ??C) (Oral)   Resp 16   Ht 5\' 8"  (1.727 m)   Wt 255 lb (115.667 kg)   BMI 38.78 kg/m2   SpO2 97%  Constitutional:  Well developed, well nourished, no acute distress, non-toxic appearance   HENT:  Atraumatic, external ears normal, nose normal.  Vascular: < 2 sec cap refill BLE, 2+ DP pulses equal bil  Musculoskeletal:  She is able to fully extend her knee and hold it straight, she is able to bend her knee about 30 degrees before pain and swelling limit her, severe edema and hematoma of the right knee over patella running down the sides of her knee, maximal tenderness is along anterior joint line, left ankle + bony tenderness over the left distal fibula, + moderate-severe swelling anterior and inferior to the lateral malleous, she is tendern in this area, pain on lateral side when pressing on medial side of ankle  Integument:  Well hydrated, no rash, + multiple abrasions of the lower right knee, not bleeding  Neurologic:  Awake alert, no slurred speech   Psychiatric: Cooperative, pleasant affect    RADIOLOGY/PROCEDURES    X-ray right knee, 4 views: IMPRESSION: 1. No acute  fracture or malalignment. 2. Postoperative changes of prior ACL repair. 3. Diffuse moderate soft tissue swelling which is most pronounced anterior to the patella.    X-ray left ankle, 3 views:  IMPRESSION: 1. Moderate to severe soft tissue swelling which is most pronounced overlying the lateral malleolus and along the anterior portion of the ankle. 2. A 0.2 cm ossific density adjacent to the medial malleolus could represent an age-indeterminate fracture. Recommend clinical correlation for point tenderness in this location. This ossific density was not visualized on the older ankle study from 2009.    ED COURSE & MEDICAL DECISION MAKING    Pertinent Labs & Imaging studies reviewed and interpreted. (See chart for details)    I independently eval this pt.  Pain is controlled after IM Dialudid.  She is instructed that because of the severe swelling hard to tell what happened on PE and needs "cool down" before fluid is attempted to be removed.  She is okay with PRICE and pain control at home and will follow up with her knee surgeon at [facility].  She has crutches at home.  Will write for wheelchair.    Filed Vitals:    12/27/11 1632 12/27/11 1636   BP: 130/91    Pulse: 91 91   Temp: 98.9 ??F (37.2 ??C) 98.9 ??F (37.2 ??C)   TempSrc: Oral    Resp: 16 16   Height: 5\' 8"  (1.727 m)    Weight: 255 lb (115.667 kg)    SpO2: 97%        Differential diagnosis: includes but not limited to Deep Vein Thrombosis,  Arterial Injury/Ischemia, Fracture, Dislocation, Infection, Compartment Syndrome, Neurologic Deficit/Injury.    FINAL IMPRESSION    Right knee swelling  Acute Left ankle sprain    PLAN  New Prescriptions    IBUPROFEN (IBU) 800 MG TABLET    Take 1 tablet by mouth every 6 hours.    OXYCODONE-ACETAMINOPHEN (PERCOCET) 5-325 MG PER TABLET    Take 1 tablet by mouth every 4 hours as needed for Pain for 25 doses.   F/u with Ander Slade at [facility] ASAP (in about 1 week)  D/c home      (Please note that this note was completed with a voice recognition  program.  Every attempt was made to edit the dictations, but inevitably there remain words that are mis-transcribed.)      Elyn Peers, PA  12/27/11 1904    Elyn Peers, [place]  12/28/11 0006

## 2011-12-27 NOTE — ED Notes (Signed)
pts spouse and children in room; Pt updated on POC; denies needs at this time; call light within reach.     Aundria Rud, LPN  78/29/56 2130

## 2011-12-27 NOTE — ED Notes (Signed)
PA BAITH REVIEWED POC WITH PT AND FAMILY.  ICE PACKS AND FOLLOWUP INSTRUCTIONS BY THIS NURSE.  NO QUESTIONS  ASSISTED TO WHEELCHAIR AND TO AUTO    Waldon Merl, RN  12/27/11 2022

## 2011-12-27 NOTE — ED Notes (Signed)
Pt to xray via cart    Aundria Rud, LPN  04/54/09 8119

## 2011-12-27 NOTE — Discharge Instructions (Signed)
Ankle Sprain  An ankle sprain happens when the bands of tissue that hold the ankle bones together (ligaments) stretch too much and tear.    HOME CARE     Put ice on the ankle for 15 to 20 minutes, 3 to 4 times a day.   Put ice in a plastic bag.   Place a towel between your skin and the bag.   You may stop icing when the puffiness (swelling) goes down.   Raise (elevate) the injured ankle to lessen puffiness.   Use crutches if your doctor tells you to. Use them until you can walk without pain.   If a plaster splint was applied:   Rest the plaster splint on nothing harder than a pillow for 24 hours.   Do not put weight on it.   Do not get it wet.   Take it off to shower or bathe.   Follow up with your doctor.   Use an elastic wrap for support. Take the wrap off if the toes lose feeling (numb), tingle, or turn cold or blue.   If an air splint was applied:   Add or release air to make it comfortable.   Take it off at night and to shower and bathe.   Wiggle your toes while wearing the air splint.   Only take medicine as told by your doctor.   Do not drive until your doctor says it is okay.  GET HELP RIGHT AWAY IF:     You have more bruising, puffiness, or pain.   Your toes feel cold.   Your medicine does not help lessen your pain.   You are losing feeling in your toes or they turn blue.   You have severe pain.  MAKE SURE YOU:     Understand these instructions.   Will watch your condition.   Will get help right away if you are not doing well or get worse.  Document Released: 07/12/2007 Document Revised: 04/17/2011 Document Reviewed: 07/12/2007  [facility] Patient Information 2013 [place], [place].        Knee Effusion  Knee effusion means you have fluid in your knee. The knee may be more difficult to bend and move.  HOME CARE   Use crutches or a brace as told by your doctor.   Put ice on the injured area.   Put ice in a plastic bag.   Place a towel between your skin and the bag.   Leave  the ice on for 15 to 20 minutes, 3 to 4 times a day.   Raise (elevate) your knee as much as possible.   Only take medicine as told by your doctor.   You may need to do strengthening exercises. Ask your doctor.   Continue with your normal diet and activities as told by your doctor.  GET HELP RIGHT AWAY IF:   You have more puffiness (swelling) in your knee.   You see redness, puffiness, or have more pain in your knee.   You have a temperature by mouth above 102 F (38.9 C).   You get a rash.   You have trouble breathing.   You have a reaction to any medicine you are taking.   You have a lot of pain when you move your knee.  MAKE SURE YOU:   Understand these instructions.   Will watch your condition.   Will get help right away if you are not doing well or get worse.  Document Released: 02/25/2010 Document  Revised: 04/17/2011 Document Reviewed: 02/25/2010  [facility] Patient Information 2013 [place].

## 2011-12-27 NOTE — ED Notes (Addendum)
Pt presents with c/o left ankle and right knee pain and swelling; pt sts she was walking outside of her employer's office building and tripped on an unlevel area of the sidewalk and fell; pt denies hitting head or LOC; pt sts she had ACL sx to right knee x's 2 yrs ago; pt sts Dr. Ander Slade @ [facility] performed sx; pt sts she notified employer [employer] worker) and provided a urine drug screen at that time; pt arrives to ED via private tx by her spouse; ice packs placed to ankle and knee; Pt updated on POC; denies needs at this time; call light within reach.     Aundria Rud, LPN  33/29/51 8841    Aundria Rud, LPN  66/06/30 1601

## 2011-12-28 MED ORDER — OXYCODONE-ACETAMINOPHEN 5-325 MG PO TABS
5-325 MG | ORAL_TABLET | ORAL | Status: DC | PRN
Start: 2011-12-28 — End: 2012-05-19

## 2011-12-28 MED ORDER — IBUPROFEN 800 MG PO TABS
800 MG | ORAL_TABLET | Freq: Four times a day (QID) | ORAL | Status: DC
Start: 2011-12-28 — End: 2013-08-20

## 2011-12-28 MED ORDER — PROMETHAZINE HCL 25 MG/ML IJ SOLN
25 MG/ML | Freq: Once | INTRAMUSCULAR | Status: AC
Start: 2011-12-28 — End: 2011-12-27
  Administered 2011-12-28: via INTRAMUSCULAR

## 2011-12-28 MED FILL — PROMETHAZINE HCL 25 MG/ML IJ SOLN: 25 MG/ML | INTRAMUSCULAR | Qty: 1

## 2012-01-09 NOTE — Patient Instructions (Signed)
Instructed patient on pain in the wrist

## 2012-01-09 NOTE — Progress Notes (Signed)
Subjective:       Jessica Krause is a 26 y.o. female referred by ED and she is now that's weeks out from her left carpal tunnel release for evaluation and treatment of an injury to the left ankle. This is evaluated as a personal injury. The injury occurred 3 months ago, and occurred while tripped and fell.  The patient states the ankle rolled inward at the time of injury.  She states that she did have some pain of time but then she sustained another fall a couple of weeks ago and she felt another pop and since that time she has had worsening pain and instability in her left ankle.  She did hear or sense a pop or snap at the time of the injury. The patient notes pain and moderate swelling of the ankle since the injury. She has treated the ankle with ice, ace wrap. Pain is localized to the lateral  malleolar area. She has sprained this ankle in the past.  She was wearing an ankle brace but this was making her pain worse so she took it off.  She states that she feels a snapping over the lateral aspect of her left ankle.  She denies numbness or tingling in the left lower extremity and denies fever chills.    In regards to her carpal tunnel release she states that she is feeling very well and is not having any pain, numbness or tingling in her left hand all.    Pain is currently rated at  2/10.        Outside reports reviewed: none.    Past Medical History   Diagnosis Date   ??? Asthma    ??? Migraine    ??? Arthritis    ??? IBS (irritable bowel syndrome)      "was dx prior to gallbladder surgery but no trouble since the gb removed"   ??? Bipolar 1 disorder    ??? Anesthesia      "I wake up freaking out after surgery"     Past Surgical History   Procedure Laterality Date   ??? Anterior cruciate ligament repair       right knee 2010(pc)   ??? Cholecystectomy  age 51-2004   ??? Hysterectomy  age 42     "removed everything but the ovaries and had to repair my bladder"   ??? Dental surgery       "wisdom teeth removed age 34, put to sleep"   ???  Carpal tunnel release  12/21/10     right hand   ??? Carpal tunnel release  01/18/11     left carpal tunnel release      Family History   Problem Relation Age of Onset   ??? Asthma Mother    ??? Mental Illness Mother    ??? Heart Disease Father      MI   ??? High Blood Pressure Father    ??? Asthma Daughter      History     Social History   ??? Marital Status: Married     Spouse Name: N/A     Number of Children: N/A   ??? Years of Education: N/A     Social History Main Topics   ??? Smoking status: Former Smoker -- 0.50 packs/day for 10 years     Quit date: 12/06/2011   ??? Smokeless tobacco: Never Used   ??? Alcohol Use: Yes      social, quit over a year ago   ???  Drug Use: No   ??? Sexually Active: Yes -- Female partner(s)     Other Topics Concern   ??? None     Social History Narrative   ??? None     Current Outpatient Prescriptions   Medication Sig Dispense Refill   ??? oxyCODONE-acetaminophen (PERCOCET) 5-325 MG per tablet Take 1 tablet by mouth every 4 hours as needed for Pain for 25 doses.  25 tablet  0   ??? ibuprofen (IBU) 800 MG tablet Take 1 tablet by mouth every 6 hours.  30 tablet  0   ??? ibuprofen (IBU) 800 MG tablet Take 1 tablet by mouth every 8 hours as needed for Pain.  30 tablet  0   ??? ibuprofen (ADVIL;MOTRIN) 200 MG CAPS Take 4 capsules by mouth continuous prn.         No current facility-administered medications for this visit.     Allergies   Allergen Reactions   ??? Inderal (Propranolol Hcl) Anaphylaxis     Heart rate dropped, couldn't walk   ??? Keflex Diarrhea   ??? Pcn (Penicillins)    ??? Amoxicillin Rash     "bloody stool"      Review of Systems - General ROS: negative for - chills, fever or night sweats  Musculoskeletal ROS: positive for - joint pain left ankle  negative for - prior injury to left ankle  Neurological ROS: negative for - numbness/tingling or weakness  Dermatological ROS: negative for - erythema, lacerations       Objective:      Breastfeeding? No    General :    Alert and oriented times three, appears stated age,  cooperative and no distress.  Mood: Normal and appropriate, no agitation     Gait:  Antalgic. The patient can bear weight on the injured extremity.   Left Ankle  Proximal Fibula:   no tenderness noted   Edema:   moderate swelling of the lateral, anterolateral surface   Ecchymosis:   is not observed    Active ROM:  100% of normal with mild pain, strength 5/5; there is palpable subluxation of her peroneal tendons with range of motion   Passive ROM:   100% of normal    Palpation:  moderate tenderness of the lateral surface   Stability.:   moderate joint laxity. Drawer sign not equal to unaffected ankle.   Syndosmosis:   syndesmotic ligament is tender   Sensation:    intact to light touch   Pulses:  normal DP and PT pulses     Contralateral extremity-examined as above for comparison with normal findings on examination.    Imaging  X-ray 3 view of left ankle from the ED reviewed by me today in the office demonstrate mild widening of the distal syndesmosis, there is resultant talar tilt compared to prior x-rays, no acute osseous abnormalities, no bony prominences, no loose bodies.            Assessment:      left ankle syndesmosis injury.  Left subluxing peroneal tendons  Ankle sprain of the left talofibular ligament      Plan:       I discussed with her today her x-ray findings.  At this point given a persistent symptoms I will obtain an MRI of her left ankle for further evaluation.  I did briefly discuss with her today surgical treatment for syndesmosis disruption as well as to repair her subluxing tendons.  Continue weight-bearing as tolerated.  Continue range of motion  exercises as instructed.  Ice and elevate as needed.  Tylenol or Motrin for pain.  Follow-up in one week after MRI for for the evaluation and treatment options

## 2012-01-25 NOTE — Progress Notes (Signed)
Subjective:       Jessica Krause is a 26 y.o. female who is here for follow up of her left ankle pain and to go over her MRI.  She states that she is still having the pain and instability in the ankle.   The patient notes pain and moderate swelling of the ankle since the injury. She has treated the ankle with ice, ace wrap. Pain is localized to the lateral  malleolar area..  She denies numbness or tingling in the left lower extremity and denies fever chills.        Pain is currently rated at  2/10.        Outside reports reviewed: none.    Past Medical History   Diagnosis Date   ??? Asthma    ??? Migraine    ??? Arthritis    ??? IBS (irritable bowel syndrome)      "was dx prior to gallbladder surgery but no trouble since the gb removed"   ??? Bipolar 1 disorder    ??? Anesthesia      "I wake up freaking out after surgery"     Past Surgical History   Procedure Laterality Date   ??? Anterior cruciate ligament repair       right knee 2010(pc)   ??? Cholecystectomy  age 71-2004   ??? Hysterectomy  age 34     "removed everything but the ovaries and had to repair my bladder"   ??? Dental surgery       "wisdom teeth removed age 64, put to sleep"   ??? Carpal tunnel release  12/21/10     right hand   ??? Carpal tunnel release  01/18/11     left carpal tunnel release      Family History   Problem Relation Age of Onset   ??? Asthma Mother    ??? Mental Illness Mother    ??? Heart Disease Father      MI   ??? High Blood Pressure Father    ??? Asthma Daughter      History     Social History   ??? Marital Status: Married     Spouse Name: N/A     Number of Children: N/A   ??? Years of Education: N/A     Social History Main Topics   ??? Smoking status: Former Smoker -- 0.50 packs/day for 10 years     Quit date: 12/06/2011   ??? Smokeless tobacco: Never Used   ??? Alcohol Use: Yes      Comment: social, quit over a year ago   ??? Drug Use: No   ??? Sexually Active: Yes -- Female partner(s)     Other Topics Concern   ??? None     Social History Narrative   ??? None     Current  Outpatient Prescriptions   Medication Sig Dispense Refill   ??? oxyCODONE-acetaminophen (PERCOCET) 5-325 MG per tablet Take 1 tablet by mouth every 4 hours as needed for Pain for 25 doses.  25 tablet  0   ??? ibuprofen (IBU) 800 MG tablet Take 1 tablet by mouth every 6 hours.  30 tablet  0   ??? ibuprofen (IBU) 800 MG tablet Take 1 tablet by mouth every 8 hours as needed for Pain.  30 tablet  0   ??? ibuprofen (ADVIL;MOTRIN) 200 MG CAPS Take 4 capsules by mouth continuous prn.         No current facility-administered medications for this visit.  Allergies   Allergen Reactions   ??? Inderal (Propranolol Hcl) Anaphylaxis     Heart rate dropped, couldn't walk   ??? Keflex Diarrhea   ??? Pcn (Penicillins)    ??? Amoxicillin Rash     "bloody stool"      Review of Systems - General ROS: negative for - chills, fever or night sweats  Musculoskeletal ROS: positive for - joint pain left ankle  positive for - prior injury to left ankle  Neurological ROS: negative for - numbness/tingling or weakness  Dermatological ROS: negative for - erythema, lacerations       Objective:      There were no vitals taken for this visit.    General :    Alert and oriented times three, appears stated age, cooperative and no distress.  Mood: Normal and appropriate, no agitation     Gait:  Antalgic. The patient can bear weight on the injured extremity.   Left Ankle  Proximal Fibula:   no tenderness noted   Edema:   moderate swelling of the lateral, anterolateral surface   Ecchymosis:   is not observed    Active ROM:  100% of normal with mild pain, strength 5/5; there is palpable subluxation of her peroneal tendons with range of motion   Passive ROM:   100% of normal    Palpation:  moderate tenderness of the lateral surface   Stability.:   moderate joint laxity. Drawer sign not equal to unaffected ankle.   Syndosmosis:   syndesmotic ligament is tender   Sensation:    intact to light touch   Pulses:  normal DP and PT pulses     Contralateral extremity-examined as  above for comparison with normal findings on examination.    Imaging  X-rays: none    We did go over her MRI today which shows a bony contusion of the talus with tibio-taloar and subtalar effusions with a moderate sprain of the anterior talofibular ligament.  There is also a partial tear of the posterior deep deltoid ligament..            Assessment:      left ankle syndesmosis injury.  Left subluxing peroneal tendons  Ankle sprain of the left talofibular ligament      Plan:       I discussed with her today her MRI findings and the fact that this is more complex than we could deal with.  We will get her referred to Dr Jake Shark in [place].  Continue weight-bearing as tolerated.  Continue range of motion exercises as instructed.  Ice and elevate as needed.  Tylenol or Motrin for pain.  Follow-up as needed.

## 2012-01-25 NOTE — Patient Instructions (Signed)
Please contact office with any questions or concerns

## 2012-05-19 ENCOUNTER — Inpatient Hospital Stay: Admit: 2012-05-19 | Discharge: 2012-05-19 | Attending: Emergency Medicine

## 2012-05-19 MED ORDER — TRAMADOL HCL 50 MG PO TABS
50 MG | ORAL_TABLET | Freq: Three times a day (TID) | ORAL | Status: AC | PRN
Start: 2012-05-19 — End: 2012-05-24

## 2012-05-19 MED ORDER — CLINDAMYCIN HCL 150 MG PO CAPS
150 MG | ORAL_CAPSULE | Freq: Three times a day (TID) | ORAL | Status: AC
Start: 2012-05-19 — End: 2012-05-29

## 2012-05-19 NOTE — ED Notes (Signed)
Left facial swellling from tooth, previous root canal  Patient has an appointment with dentist on thursday    Vira Agar, RN  05/19/12 [phone]

## 2012-05-19 NOTE — ED Notes (Signed)
[id number] Expected date:<BR> Expected time:<BR> Means of arrival:<BR> Comments:<BR> toothache

## 2012-05-19 NOTE — ED Provider Notes (Signed)
eMERGENCY dEPARTMENT eNCOUnter      [place]    Chief Complaint   Patient presents with   ??? Dental Problem     previous root canal       HPI    Jessica Krause is a 27 y.o. female who presents with dental pain localized in the left upper area of the mouth, onset was over past few weeks but worse yesterday. The duration has been constant since the onset. The pain quality is sharp. The pain worsens with chewing. Patient will be seeing a dentist this week     REVIEW OF SYSTEMS    Cardiac: Denies Chest Pain or syncope  Respiratory: Denies SOB or dyspnea  GI: Denies Vomiting, or nausea  General: Denies Fever, Denies Chills    PAST MEDICAL & SURGICAL HISTORY    Past Medical History   Diagnosis Date   ??? Asthma    ??? Migraine    ??? Arthritis    ??? IBS (irritable bowel syndrome)      "was dx prior to gallbladder surgery but no trouble since the gb removed"   ??? Bipolar 1 disorder    ??? Anesthesia      "I wake up freaking out after surgery"     Past Surgical History   Procedure Laterality Date   ??? Anterior cruciate ligament repair       right knee 2010(pc)   ??? Cholecystectomy  age 77-2004   ??? Hysterectomy  age 30     "removed everything but the ovaries and had to repair my bladder"   ??? Dental surgery       "wisdom teeth removed age 34, put to sleep"   ??? Carpal tunnel release  12/21/10     right hand   ??? Carpal tunnel release  01/18/11     left carpal tunnel release        CURRENT MEDICATIONS    Current Outpatient Rx   Name  Route  Sig  Dispense  Refill   ??? clindamycin (CLEOCIN) 150 MG capsule    Oral    Take 1 capsule by mouth 3 times daily for 10 days.    30 capsule    0     ??? traMADol (ULTRAM) 50 MG tablet    Oral    Take 1 tablet by mouth every 8 hours as needed for Pain for 5 days.    15 tablet    0     ??? ibuprofen (IBU) 800 MG tablet    Oral    Take 1 tablet by mouth every 6 hours.    30 tablet    0     ??? ibuprofen (IBU) 800 MG tablet    Oral    Take 1 tablet by mouth every 8 hours as needed for Pain.    30 tablet     0     ??? ibuprofen (ADVIL;MOTRIN) 200 MG CAPS    Oral    Take 4 capsules by mouth continuous prn.                 ALLERGIES    Allergies   Allergen Reactions   ??? Inderal (Propranolol Hcl) Anaphylaxis     Heart rate dropped, couldn't walk   ??? Cephalexin Diarrhea   ??? Pcn (Penicillins)    ??? Amoxicillin Rash     "bloody stool"       SOCIAL & FAMILY HISTORY    History  Social History   ??? Marital Status: Married     Spouse Name: N/A     Number of Children: N/A   ??? Years of Education: N/A     Social History Main Topics   ??? Smoking status: Former Smoker -- 0.50 packs/day for 10 years     Quit date: 12/06/2011   ??? Smokeless tobacco: Never Used   ??? Alcohol Use: Yes      Comment: social, quit over a year ago   ??? Drug Use: No   ??? Sexually Active: Yes -- Female partner(s)     Other Topics Concern   ??? None     Social History Narrative   ??? None     Family History   Problem Relation Age of Onset   ??? Asthma Mother    ??? Mental Illness Mother    ??? Heart Disease Father      MI   ??? High Blood Pressure Father    ??? Asthma Daughter        PHYSICAL EXAM    VITAL SIGNS: BP 148/96   Pulse 88   Temp(Src) 98.3 ??F (36.8 ??C) (Oral)   Resp 16   Ht 5' 8"$  (1.727 m)   Wt 270 lb (122.471 kg)   BMI 41.06 kg/m2   SpO2 99%   Constitutional:  Well nourished, no acute distress   Oral: (+) dental decay and + tender to percussion over tooth #15.   No tongue swelling, throat is clear and patent.  HENT:  Atraumatic, moist mucus membranes,  No trismus  Neck: supple   Respiratory:  No retractions   Cardiovascular:  No JVD  Integument:  Skin is warm and dry   Neurologic:  Alert & oriented, no slurred speech      ED COURSE & MEDICAL DECISION MAKING    Pertinent Labs & Imaging studies reviewed and interpreted. (See chart for details)    Differential Diagnosis: Dental Abscess, Airway Obstruction, Ludwig's Angina, Bacteremia/Sepsis.    FINAL IMPRESSION    Toothache    Plan: Oral analgesics, antibiotics, and follow up with a dentist as an outpatient.        (Please  note that this note was completed with a voice recognition program.  Every attempt was made to edit the dictations, but inevitably there remain words that are mis-transcribed.)    Cindee Lame, MD  05/19/12 [phone]

## 2012-05-19 NOTE — Discharge Instructions (Signed)
Dental Pain  A tooth ache may be caused by cavities (tooth decay). Cavities expose the nerve of the tooth to air and hot or cold temperatures. It may come from an infection or abscess (also called a boil or furuncle) around your tooth. It is also often caused by dental caries (tooth decay). This causes the pain you are having.  DIAGNOSIS   Your caregiver can diagnose this problem by exam.  TREATMENT    If caused by an infection, it may be treated with medications which kill germs (antibiotics) and pain medications as prescribed by your caregiver. Take medications as directed.   Only take over-the-counter or prescription medicines for pain, discomfort, or fever as directed by your caregiver.   Whether the tooth ache today is caused by infection or dental disease, you should see your dentist as soon as possible for further care.  SEEK MEDICAL CARE IF:  The exam and treatment you received today has been provided on an emergency basis only. This is not a substitute for complete medical or dental care. If your problem worsens or new problems (symptoms) appear, and you are unable to meet with your dentist, call or return to this location.  SEEK IMMEDIATE MEDICAL CARE IF:    You have a fever.   You develop redness and swelling of your face, jaw, or neck.   You are unable to open your mouth.   You have severe pain uncontrolled by pain medicine.  MAKE SURE YOU:    Understand these instructions.   Will watch your condition.   Will get help right away if you are not doing well or get worse.  Document Released: 01/23/2005 Document Revised: 04/17/2011 Document Reviewed: 09/11/2007  [facility] Patient Information 2013 [place].

## 2012-12-02 NOTE — ED Provider Notes (Signed)
Triage Chief Complaint:   Dental Pain    HOPI:  Jessica Krause is a 27 y.o. female that presents complaining of dental pain.  Patient states she had a root canal many years ago.  And the cap is about to fall off.  She complains that she had a "pus pocket on the roof of her mouth as well.  No fever, chills, nausea, vomiting, head pain, headache, diarrhea, shortness breath or abdominal pain.    ROS:  General:  No fevers  Eyes:  no discharge  ENT:  No sore throat, no nasal congestion, no hearing changes, + dental pain  Cardiovascular:  No chest pain  Respiratory:  no cough  Gastrointestinal:  no nausea, no vomiting  Musculoskeletal:   no joint pain  Skin:  No rash  Neurologic:  No speech problems, no headache  Genitourinary:  No dysuria    Past Medical History   Diagnosis Date   ??? Asthma    ??? Migraine    ??? Arthritis    ??? IBS (irritable bowel syndrome)      "was dx prior to gallbladder surgery but no trouble since the gb removed"   ??? Bipolar 1 disorder (HCC)    ??? Anesthesia      "I wake up freaking out after surgery"   ??? Polycystic ovarian syndrome      Past Surgical History   Procedure Laterality Date   ??? Anterior cruciate ligament repair       right knee 2010(pc)   ??? Cholecystectomy  age 53-2004   ??? Hysterectomy  age 66     "removed everything but the ovaries and had to repair my bladder"   ??? Dental surgery       "wisdom teeth removed age 13, put to sleep"   ??? Carpal tunnel release  12/21/10     right hand   ??? Carpal tunnel release  01/18/11     left carpal tunnel release      Family History   Problem Relation Age of Onset   ??? Asthma Mother    ??? Mental Illness Mother    ??? Heart Disease Father      MI   ??? High Blood Pressure Father    ??? Asthma Daughter      History     Social History   ??? Marital Status: Married     Spouse Name: N/A     Number of Children: N/A   ??? Years of Education: N/A     Occupational History   ??? Not on file.     Social History Main Topics   ??? Smoking status: Former Smoker -- 0.50 packs/day for 10  years     Quit date: 12/06/2011   ??? Smokeless tobacco: Never Used   ??? Alcohol Use: Yes      Comment: social, quit over a year ago   ??? Drug Use: No   ??? Sexual Activity:     Partners: Male     Other Topics Concern   ??? Not on file     Social History Narrative     Current Facility-Administered Medications   Medication Dose Route Frequency Provider Last Rate Last Dose   ??? clindamycin (CLEOCIN) capsule 150 mg  150 mg Oral Once [employer], PA-C       ??? HYDROcodone-acetaminophen (NORCO) 5-325 MG per tablet 1 tablet  1 tablet Oral Once Hubert Azure, PA-C         Current  Outpatient Prescriptions   Medication Sig Dispense Refill   ??? norgestimate-ethinyl estradiol (SPRINTEC 28) 0.25-35 MG-MCG per tablet Take 1 tablet by mouth daily.       ??? clindamycin (CLEOCIN) 150 MG capsule Take 2 capsules by mouth 4 times daily for 7 days. Pharmacist: May substitute 150mg  tabs and take 2 tabs per dose.  28 capsule  0   ??? HYDROcodone-acetaminophen (NORCO) 5-325 MG per tablet Take 1-2 tablets by mouth every 4 hours as needed for Pain.  20 tablet  0   ??? naproxen (NAPROSYN) 500 MG tablet Take 1 tablet by mouth 2 times daily as needed for Pain for up to 10 days.  20 tablet  0   ??? ibuprofen (IBU) 800 MG tablet Take 1 tablet by mouth every 6 hours.  30 tablet  0   ??? ibuprofen (IBU) 800 MG tablet Take 1 tablet by mouth every 8 hours as needed for Pain.  30 tablet  0   ??? ibuprofen (ADVIL;MOTRIN) 200 MG CAPS Take 4 capsules by mouth continuous prn.         Allergies   Allergen Reactions   ??? Inderal [Propranolol Hcl] Anaphylaxis     Heart rate dropped, couldn't walk   ??? Cephalexin Diarrhea   ??? Pcn [Penicillins]    ??? Amoxicillin Rash     "bloody stool"       Nursing Notes Reviewed    Physical Exam:  ED Triage Vitals   Enc Vitals Group      BP 12/02/12 2125 143/97 mmHg      Pulse 12/02/12 2125 88      Resp 12/02/12 2125 18      Temp 12/02/12 2125 98.1 ??F (36.7 ??C)      Temp Source 12/02/12 2125 Oral      SpO2 12/02/12 2125 96 %       Weight 12/02/12 2125 260 lb (117.935 kg)      Height 12/02/12 2125 5\' 8"  (1.727 m)      Head Cir --       Peak Flow --       Pain Score --       Pain Loc --       Pain Edu? --       Excl. in GC? --        General appearance:  No acute distress.   Skin:  Warm. Dry.   Eye:  Extraocular movements intact.     Ears, nose, mouth and throat:  Oral mucosa moist, no edema under the tongue, posterior pharynx without erythema, exudate or edema, no gumline erythema noted, no abscess, tooth #15 does appear cracked in half.  No visible discharge no abscess.  Neck:  Trachea midline.  No tender palpable cervical lymphadenopathy  Extremity:  Normal ROM     Respiratory:  Respirations nonlabored.             Neurological:  Alert and oriented    I have reviewed and interpreted all of the currently available lab results from this visit (if applicable):  No results found for this visit on 12/02/12.   Radiographs (if obtained):  []  The following radiograph was interpreted by myself in the absence of a radiologist:   []  Radiologist's Report Reviewed:         EKG (if obtained): (All EKG's are interpreted by myself in the absence of a cardiologist)    Chart review shows recent radiographs:  No results found.    MDM:  Patient presenting for dental pain, no abscess, no Ludwig's angina, has multiple chronic dental caries, will be given antibiotics, pain medications, and dental clinic/office list provided.  I stressed the need for dental followup as emergency department treatment is not the definitive treatment if choice. i independently managed patient. Attending physician Dr. Milana Huntsman    Clinical Impression:  1. Pain, dental      Disposition referral (if applicable):  No follow-up provider specified.  Disposition medications (if applicable):  New Prescriptions    CLINDAMYCIN (CLEOCIN) 150 MG CAPSULE    Take 2 capsules by mouth 4 times daily for 7 days. Pharmacist: May substitute 150mg  tabs and take 2 tabs per dose.     HYDROCODONE-ACETAMINOPHEN (NORCO) 5-325 MG PER TABLET    Take 1-2 tablets by mouth every 4 hours as needed for Pain.    NAPROXEN (NAPROSYN) 500 MG TABLET    Take 1 tablet by mouth 2 times daily as needed for Pain for up to 10 days.     (Please note that portions of this note may have been completed with a voice recognition program. Efforts were made to edit the dictations but occasionally words are mis-transcribed.)    Hubert Azure, PA-C  12/02/12 2322    Hubert Azure, PA-C  12/02/12 2327

## 2012-12-02 NOTE — Discharge Instructions (Signed)
Dental Pain: After Your Visit  Your Care Instructions  The most common cause of dental pain is tooth decay. It can also be caused by an infection of the tooth (abscess) or gum, a tooth that has not broken all the way through the gum (impacted tooth), or a problem with the nerve-filled center of the tooth.  Follow-up care is a key part of your treatment and safety. Be sure to make and go to all appointments, and call your doctor if you are having problems. It's also a good idea to know your test results and keep a list of the medicines you take.  How can you care for yourself at home?   Contact a dentist for follow-up care.   Put ice or a cold pack on the outside of your mouth for 10 to 20 minutes at a time to reduce pain and swelling. Put a thin cloth between the ice and your skin.   Take an over-the-counter pain medicine, such as acetaminophen (Tylenol), ibuprofen (Advil, Motrin), or naproxen (Aleve). Read and follow all instructions on the label.   Do not take two or more pain medicines at the same time unless the doctor told you to. Many pain medicines have acetaminophen, which is Tylenol. Too much acetaminophen (Tylenol) can be harmful.   Rinse your mouth with warm salt water every 2 hours to help relieve pain and swelling from an infected tooth. Mix 1 teaspoon of salt in 8 ounces of water.   If your doctor prescribed antibiotics, take them as directed. Do not stop taking them just because you feel better. You need to take the full course of antibiotics.  When should you call for help?  Call your doctor now or seek immediate medical care if:   You have signs of infection, such as:   Increased pain, swelling, warmth, or redness.   Pus draining from the gum, tooth, or face.   A fever.  Watch closely for changes in your health, and be sure to contact your doctor if:   You do not get better as expected.   Where can you learn more?   Go to [email or web address] and sign in to your  MyChart account. Enter V264 in the Search Health Information box to learn more about "Dental Pain: After Your Visit."    If you do not have an account, please click on the "Sign Up Now" link.      2006-2014 [employer], Inc[employer]. Care instructions adapted under license by [facility]. This care instruction is for use with your licensed healthcare professional. If you have questions about a medical condition or this instruction, always ask your healthcare professional. Healthwise, Incorporated disclaims any warranty or liability for your use of this information.  Content Version: 10.0.273164; Last Revised: Jun 23, 2011

## 2012-12-03 ENCOUNTER — Inpatient Hospital Stay: Admit: 2012-12-03 | Discharge: 2012-12-03

## 2012-12-03 MED ORDER — HYDROCODONE-ACETAMINOPHEN 5-325 MG PO TABS
5-325 MG | ORAL_TABLET | ORAL | Status: DC | PRN
Start: 2012-12-03 — End: 2013-08-07

## 2012-12-03 MED ORDER — CLINDAMYCIN HCL 150 MG PO CAPS
150 MG | Freq: Once | ORAL | Status: AC
Start: 2012-12-03 — End: 2012-12-02
  Administered 2012-12-03: 04:00:00 via ORAL

## 2012-12-03 MED ORDER — NAPROXEN 500 MG PO TABS
500 MG | ORAL_TABLET | Freq: Two times a day (BID) | ORAL | Status: AC | PRN
Start: 2012-12-03 — End: 2012-12-12

## 2012-12-03 MED ORDER — HYDROCODONE-ACETAMINOPHEN 5-325 MG PO TABS
5-325 MG | Freq: Once | ORAL | Status: AC
Start: 2012-12-03 — End: 2012-12-02
  Administered 2012-12-03: 04:00:00 via ORAL

## 2012-12-03 MED ORDER — CLINDAMYCIN HCL 150 MG PO CAPS
150 MG | ORAL_CAPSULE | Freq: Four times a day (QID) | ORAL | Status: AC
Start: 2012-12-03 — End: 2012-12-09

## 2012-12-03 MED FILL — CLINDAMYCIN HCL 150 MG PO CAPS: 150 MG | ORAL | Qty: 1

## 2012-12-03 MED FILL — HYDROCODONE-ACETAMINOPHEN 5-325 MG PO TABS: 5-325 MG | ORAL | Qty: 1

## 2013-06-16 LAB — ALT: ALT: 15 U/L (ref 10–40)

## 2013-06-17 LAB — HIV ANTIGEN/ANTIBODY
HIV 1/2 Antibody: NONREACTIVE
HIV-1 P24 Ag Screen: NONREACTIVE

## 2013-06-18 LAB — HEPATITIS C ANTIBODY
Hep C Ab Interp: NEGATIVE
Hepatitis C Ab: <0.02

## 2013-06-18 LAB — HEPATITIS B SURFACE ANTIGEN: Hepatitis B Surface Ag: NEGATIVE

## 2013-06-18 LAB — HEPATITIS B SURFACE ANTIBODY: Hep B S Ab: 13.44

## 2013-07-28 LAB — ALT: ALT: 15 U/L (ref 10–40)

## 2013-07-30 LAB — HEPATITIS C ANTIBODY
Hep C Ab Interp: NEGATIVE
Hepatitis C Ab: 0.1

## 2013-08-07 NOTE — ED Notes (Signed)
Was at a cookout and was hit in Rt cheek with a basketball. Now having problem with Rt eye. Has Hx of droppy eyelid but this is different. Not really painful.    Porfirio OarJenny J Samary Shatz, RN  08/07/13 [phone]

## 2013-08-07 NOTE — ED Provider Notes (Signed)
eMERGENCY dEPARTMENT eNCOUnter        [place]    Chief Complaint   Patient presents with   ??? Eye Injury       HPI    Jessica LASCALA is a 28 y.o. female who presents with a black spot in her vision. Onset was earlier this evening while playing basketball, someone else shot it at the backboard, it bounced off and it hit her around her eye and against her and her right cheek.  The duration has been since the onset.  The quality of the pain is none, denies any pain, or any other changes in her vision. The patient has associated bruising to her right cheek. There are no aggravating or alleviating factors. The spot moves as she moves her eye. She states that this looks like a black spot after looking at a bright light for a long period of time. No fevers, n/v, headache and stays in the lower left visual field out of her right eye. She is supposed to wera glasses but does not.    REVIEW OF SYSTEMS    General: No fevers or chills  GI: No nausea or vomiting  Skin: No new rash  Pulmonary: No shortness of breath or new cough  See HPI for further details.    PAST MEDICAL and SURGICAL HISTORY    Past Medical History   Diagnosis Date   ??? Asthma    ??? Migraine    ??? Arthritis    ??? IBS (irritable bowel syndrome)      "was dx prior to gallbladder surgery but no trouble since the gb removed"   ??? Bipolar 1 disorder ([place])    ??? Anesthesia      "I wake up freaking out after surgery"   ??? Polycystic ovarian syndrome      Past Surgical History   Procedure Laterality Date   ??? Anterior cruciate ligament repair       right knee 2010(pc)   ??? Cholecystectomy  age 86-2004   ??? Hysterectomy  age 14     "removed everything but the ovaries and had to repair my bladder"   ??? Dental surgery       "wisdom teeth removed age 14, put to sleep"   ??? Carpal tunnel release  12/21/10     right hand   ??? Carpal tunnel release  01/18/11     left carpal tunnel release        CURRENT MEDICATIONS    Current Outpatient Rx   Name  Route  Sig  Dispense  Refill   ???  ibuprofen (ADVIL;MOTRIN) 200 MG CAPS    Oral    Take 4 capsules by mouth continuous prn.             ??? norgestimate-ethinyl estradiol (SPRINTEC 28) 0.25-35 MG-MCG per tablet    Oral    Take 1 tablet by mouth daily.             ??? ibuprofen (IBU) 800 MG tablet    Oral    Take 1 tablet by mouth every 6 hours.    30 tablet    0     ??? ibuprofen (IBU) 800 MG tablet    Oral    Take 1 tablet by mouth every 8 hours as needed for Pain.    30 tablet    0         ALLERGIES    Allergies   Allergen Reactions   ???  Inderal [Propranolol Hcl] Anaphylaxis     Heart rate dropped, couldn't walk   ??? Cephalexin Diarrhea   ??? Pcn [Penicillins]    ??? Amoxicillin Rash     "bloody stool"       SOCIAL and FAMILY HISTORY    History     Social History   ??? Marital Status: Married     Spouse Name: N/A     Number of Children: N/A   ??? Years of Education: N/A     Social History Main Topics   ??? Smoking status: Former Smoker -- 0.50 packs/day for 10 years     Quit date: 12/06/2011   ??? Smokeless tobacco: Never Used   ??? Alcohol Use: Yes      Comment: social, quit over a year ago   ??? Drug Use: No   ??? Sexual Activity:     Partners: Male     Other Topics Concern   ??? None     Social History Narrative     Family History   Problem Relation Age of Onset   ??? Asthma Mother    ??? Mental Illness Mother    ??? Heart Disease Father      MI   ??? High Blood Pressure Father    ??? Asthma Daughter        PHYSICAL EXAM    VITAL SIGNS: BP 140/82    Pulse 98    Temp(Src) 98.4 ??F (36.9 ??C) (Oral)    Resp 18    Ht 5' 8"$  (1.727 m)    Wt 280 lb (127.007 kg)    BMI 42.58 kg/m2      SpO2 97%   Constitutional:  Well developed, well nourished, no acute distress,   Eyes:  Pupils equally round and reactive to light, sclerae nonicteric, conjunctiva moist, no injection to her eyes, no bleeding to her eyes, anterior chamber clear, no marcus gun pupil, EOMI, visual acuity is 20/20 Left and Right, fundoscopic exam shows a tiny clear spot, appears to be a bubble, in the lower left portion of her  vitrious in her right eye, there is no retinal hemorrhaging, fundus is not swollen, no petechiae  HENT:  Atraumatic, external ears normal, nose normal, oropharynx moist, no pharyngeal exudates.   Neck: Supple, no neck swelling   Respiratory:  No retractions  Integument:  Warm dry skin, no obvious rash  Neurologic:  No slurred speech, normal gait     ED COURSE & MEDICAL DECISION MAKING    Pertinent Labs & Imaging studies reviewed and interpreted. (See chart for details)    I independently eval this pt. I had a long discussion with this pt regarding her vision change and the likely cause (overpolarization of part her retina vs a floater). I gave her Dr. Chaney Malling cell phone number as he instructed after I spoke to him regarding her and he will see her in the office on Monday unless she has vision changes in between in which case sooner. She is agreeable to this.    Filed Vitals:    08/07/13 2229   BP: 140/82   Pulse: 98   Temp: 98.4 ??F (36.9 ??C)   Resp: 18       Differential diagnosis: Iritis, Conjunctivitis, Herpes Keratitis, Corneal Ulcer, Corneal Abrasion, Acute Angle Glaucoma, Orbital Cellulitis/Abscess, Periorbital Cellulitis, other.    CLINICAL IMPRESSION    Vision change, right eye    Plan  New Prescriptions    No medications on file   F/u  with opthalmology on Monday  D/c home      (Please note that this note was completed with a voice recognition program.  Every attempt was made to edit the dictations, but inevitably there remain words that are mis-transcribed.)      Johny Shock, [place]  08/08/13 0036

## 2013-08-08 ENCOUNTER — Inpatient Hospital Stay: Admit: 2013-08-08 | Discharge: 2013-08-08

## 2013-08-08 NOTE — ED Notes (Signed)
Pt to follow up with Dr Burman RiisShell    Rochell Puett R Krisalyn Yankowski, RN  08/08/13 [phone]

## 2013-08-19 NOTE — ED Notes (Signed)
Patient reports dizziness, pain in entire head and face ever since 08/07/13 when she was hit in the head with a basketball.    Jessica Krause B. Lyn Hollingshead, RN  08/19/13 2236

## 2013-08-20 ENCOUNTER — Inpatient Hospital Stay: Admit: 2013-08-20 | Discharge: 2013-08-20 | Attending: Emergency Medicine

## 2013-08-20 MED ORDER — NORCO 5-325 MG PO TABS
5-325 MG | ORAL_TABLET | Freq: Four times a day (QID) | ORAL | Status: AC | PRN
Start: 2013-08-20 — End: 2013-08-27

## 2013-08-20 NOTE — ED Provider Notes (Signed)
Triage Chief Complaint:   Dizziness      HOPI:  Jessica Krause is a 28 y.o. female that presents with dizziness.  Patient was hit in the right face and eye with a basketball 2 weeks ago.  She did not have a loss of consciousness but she did have some visual changes and some problems with her right eye and ear.  She was followed up by ophthalmology and did not have any evidence of retinal detachment or vitreous hemorrhage.  Her visual acuity was never altered.  Since the original injury however she's had pain in the right ear and has had some vague dizziness.  When she bends over or stands up abruptly she has some vertiginous symptoms.  She is not vomited and she does not have a severe headache she has not had any focal neurologic changes or loss of consciousness.  She does not have any neck pain.    ROS:  At least 10 systems reviewed and otherwise negative except as in the [place].    Past Medical History   Diagnosis Date   ??? Asthma    ??? Migraine    ??? Arthritis    ??? IBS (irritable bowel syndrome)      "was dx prior to gallbladder surgery but no trouble since the gb removed"   ??? Bipolar 1 disorder ([place])    ??? Anesthesia      "I wake up freaking out after surgery"   ??? Polycystic ovarian syndrome      Past Surgical History   Procedure Laterality Date   ??? Anterior cruciate ligament repair       right knee 2010(pc)   ??? Cholecystectomy  age 30-2004   ??? Hysterectomy  age 78     "removed everything but the ovaries and had to repair my bladder"   ??? Dental surgery       "wisdom teeth removed age 35, put to sleep"   ??? Carpal tunnel release  12/21/10     right hand   ??? Carpal tunnel release  01/18/11     left carpal tunnel release      Family History   Problem Relation Age of Onset   ??? Asthma Mother    ??? Mental Illness Mother    ??? Heart Disease Father      MI   ??? High Blood Pressure Father    ??? Asthma Daughter      History     Social History   ??? Marital Status: Married     Spouse Name: N/A     Number of Children: N/A   ??? Years  of Education: N/A     Occupational History   ??? Not on file.     Social History Main Topics   ??? Smoking status: Former Smoker -- 0.50 packs/day for 10 years     Quit date: 12/06/2011   ??? Smokeless tobacco: Never Used   ??? Alcohol Use: Yes      Comment: social, quit over a year ago   ??? Drug Use: No   ??? Sexual Activity:     Partners: Male     Other Topics Concern   ??? Not on file     Social History Narrative     No current facility-administered medications for this encounter.     Current Outpatient Prescriptions   Medication Sig Dispense Refill   ??? guaiFENesin 400 MG tablet Take 400 mg by mouth 4 times daily as needed for  Cough       ??? HYDROcodone-acetaminophen (NORCO) 5-325 MG TABS Take 1 tablet by mouth every 6 hours as needed  20 tablet  no   ??? ibuprofen (IBU) 800 MG tablet Take 1 tablet by mouth every 8 hours as needed for Pain.  30 tablet  0     Allergies   Allergen Reactions   ??? Inderal [Propranolol Hcl] Anaphylaxis     Heart rate dropped, couldn't walk   ??? Cephalexin Diarrhea   ??? Pcn [Penicillins]    ??? Amoxicillin Rash     "bloody stool"     Nursing Notes Reviewed    Physical Exam:  ED Triage Vitals   Enc Vitals Group      BP 08/19/13 2217 136/77 mmHg      Pulse 08/19/13 2217 89      Resp 08/19/13 2217 16      Temp 08/19/13 2217 98.1 ??F (36.7 ??C)      Temp Source 08/19/13 2217 Oral      SpO2 --       Weight 08/19/13 2217 275 lb (124.739 kg)      Height 08/19/13 2217 5' 8"$  (1.727 m)      Head Cir --       Peak Flow --       Pain Score --       Pain Loc --       Pain Edu? --       Excl. in GC? --        GENERAL APPEARANCE: A well-developed well-nourished white female in mild distress  HEAD: Normocephalic. Atraumatic.  EYES: PERRL EOM's  intact. Sclera anicteric.no conjunctival injection  ENT: Mucous membranes moist. Tolerates saliva. No trismus.No nasal discharge TMs and pharynx are clear the neck was supple  NECK: Supple. No meningismus. Trachea midline. Thyroid normal, no adenopathy  HEART: RRR without rubs  murmurs or gallops. Radial pulses 2+.   LUNGS: Clear good air movement no wheezing no retraction or accessory muscle use  ABDOMEN: Soft. Non-tender. No guarding or rebound. No mass and no hepatosplenomegaly.   BACK: No pain to palpation of the midline  EXTREMITIES: No acute deformities. No peripheral edema  SKIN: Warm and dry.no rash  NEUROLOGICAL: Oriented X 3, No facial drooping.  Patient has ptosis that has been long-standing on the right eye.  Cranial nerves II through XII are intact motor is 5 over 5 in all groups finger-nose intact no pronator drift normal sensation symmetric reflexes normal gait   PSYCHIATRIC: Normal mood.    I have reviewed and interpreted all of the currently available lab results from this visit (if applicable):  No results found for this visit on 08/19/13.     Radiographs (if obtained):  []$  Radiologist's Report Reviewed:  CT HEAD WO CONTRAST    Final Result:            []$  Discussed with Radiologist:     []$  The following radiograph was interpreted by myself in the absence of a radiologist:     EKG (if obtained): (All EKG's are interpreted by myself in the absence of a cardiologist)      MDM:   Patient with a concussion presents for evaluation. Not at al toxic and CT scan was negative. She will be treated as an OP with pain meds and is to FU with LMD but to return to the ED if the illness worsens or changes    Final Impression:  1. Concussion  Critical Care:    Disposition referral (if applicable):  Gaetana Michaelis, MD  [place]  [phone]    Call in 1 day        Disposition medications (if applicable):  New Prescriptions    HYDROCODONE-ACETAMINOPHEN (NORCO) 5-325 MG TABS    Take 1 tablet by mouth every 6 hours as needed       (Please note that portions of this note may have been completed with a voice recognition program. Efforts were made to edit the dictations but occasionally words are mis-transcribed.)    Diona Browner, MD           Lenna Sciara, MD  08/20/13 [phone]

## 2013-08-20 NOTE — ED Notes (Signed)
Handoff report given to Shirlean Mylar, Therapist, sports. Care of pt returned at this time.     Maryelizabeth Kaufmann, RN  08/20/13 [phone]

## 2013-08-20 NOTE — Discharge Instructions (Signed)
Concussion: After Your Visit  Your Care Instructions     A concussion is a kind of injury to the brain. It happens when the head receives a hard blow. The impact can jar or shake the brain against the skull. This interrupts the brain's normal activities. Although you may have cuts or bruises on your head or face, you may have no other visible signs of a brain injury. In most cases, damage to the brain from a concussion can't be seen in tests such as a CT or MRI scan.  For a few weeks, you may have low energy, dizziness, trouble sleeping, a headache, ringing in your ears, or nausea. You may also feel anxious, grumpy, or depressed. You may have problems with memory and concentration. These symptoms are common after a concussion. They should slowly improve over time. Sometimes this takes weeks or even months. Someone who lives with you should know how to care for you. Please share this and all information with a caregiver who will be available to help if needed.  Follow-up care is a key part of your treatment and safety. Be sure to make and go to all appointments, and call your doctor if you are having problems. It's also a good idea to know your test results and keep a list of the medicines you take.  How can you care for yourself at home?  Pain control   Put ice or a cold pack on the part of your head that hurts for 10 to 20 minutes at a time. Put a thin cloth between the ice and your skin.   Be safe with medicines. Read and follow all instructions on the label.   If the doctor gave you a prescription medicine for pain, take it as prescribed.   If you are not taking a prescription pain medicine, ask your doctor if you can take an over-the-counter medicine.  Recovery   Have another adult watch you closely for the next 24 hours. That person should check for signs that your symptoms are getting worse.   Rest is the best way to recover from a concussion. You need to rest your body and your brain:   Get plenty of  sleep at night. And take rest breaks during the day.   Avoid activities that take a lot of physical or mental work. This includes housework, exercise, schoolwork, video games, text messaging, and using the computer.   You may need to change your school or work schedule while you recover.   Return to your normal activities slowly. Do not try to do too much at once.   Do not drink alcohol or use illegal drugs. Alcohol and illegal drugs can slow your recovery. And they can increase your risk of a second brain injury.   Avoid activities that could lead to another concussion. Follow your doctor's instructions for a gradual return to activity and sports.   Ask your doctor when it's okay for you to drive a car, ride a bike, or operate machinery.  How should you return to activity?  Your return to sports or activity should be gradual. It should only begin when all symptoms of a concussion are gone, both while at rest and during exercise or exertion.  Doctors and concussion specialists suggest steps to follow for returning to sports after a concussion. Use these steps as a guide. Your doctor must always make the final decision about whether you are ready to go back to full-contact play. You should slowly   progress through the following levels of activity:  1. No activity. This means complete physical and mental rest.  2. Light aerobic activity. This can include walking, swimming, or other exercise at less than 70% of maximum heart rate. No resistance training is included in this step.  3. Sport-specific exercise. This includes running drills or skating drills (depending on the sport), but no head impact.  4. Noncontact training drills. This includes more complex training drills such as passing. The athlete may also begin light resistance training.  5. Full contact practice. A medical professional must agree that the athlete is ready. The athlete can participate in normal training.  6. Return to normal game play. This is  the final step and allows the athlete to join in normal game play.  Watch and keep track of your progress. It should take at least 6 days for you to go from light activity to normal game play.  Make sure that you can stay at each new level of activity for at least 24 hours without symptoms, or as long as your doctor says, before doing more. If one or more symptoms come back, return to a lower level of activity for at least 24 hours. Don't move on until all symptoms are gone.  When should you call for help?  Call 911 anytime you think you may need emergency care. For example, call if:   You have a seizure.   You passed out (lost consciousness).   You are confused or can't stay awake.  Call your doctor now or seek immediate medical care if:   You have new or worse vomiting.   You feel less alert.   You have new weakness or numbness in any part of your body.  Watch closely for changes in your health, and be sure to contact your doctor if:   You do not get better as expected.   You have new symptoms, such as headaches, trouble concentrating, or changes in mood.   Where can you learn more?   Go to [email or web address] and sign in to your MyChart account. Enter Z711 in the Search Health Information box to learn more about "Concussion: After Your Visit."    If you do not have an account, please click on the "Sign Up Now" link.      2006-2015 Healthwise, Incorporated. Care instructions adapted under license by  Health. This care instruction is for use with your licensed healthcare professional. If you have questions about a medical condition or this instruction, always ask your healthcare professional. [employer], Inc[employer] disclaims any warranty or liability for your use of this information.  Content Version: 10.5.422740; Current as of: March 28, 2013

## 2013-09-08 LAB — ALT: ALT: 13 U/L (ref 10–40)

## 2013-09-09 LAB — HEPATITIS C ANTIBODY
Hep C Ab Interp: NEGATIVE
Hepatitis C Ab: 0.13

## 2013-09-27 ENCOUNTER — Inpatient Hospital Stay: Admit: 2013-09-27 | Discharge: 2013-09-27 | Attending: Emergency Medicine

## 2013-09-27 LAB — PREGNANCY, URINE
Pregnancy, Urine: NEGATIVE
Specific Gravity, Urine: 1.013 (ref 1.001–1.035)

## 2013-09-27 LAB — URINALYSIS WITH MICROSCOPIC
Bilirubin Urine: NEGATIVE MG/DL
Blood, Urine: NEGATIVE
Cast Type: NONE SEEN /LPF
Crystal Type: NONE SEEN /HPF
Epithelial Cells, UA: 9 /HPF
Glucose, Urine: NEGATIVE MG/DL
Ketones, Urine: NEGATIVE MG/DL
Leukocyte Esterase, Urine: NEGATIVE
Nitrite Urine, Quantitative: NEGATIVE
Protein, UA: NEGATIVE MG/DL
RBC, UA: 5 /HPF (ref 0–6)
Specific Gravity, UA: 1.013 (ref 1.001–1.035)
Urobilinogen, Urine: 0.2 EU/DL (ref 0.2–1.0)
Volume, (UVOL): 12 ML (ref 10–12)
WBC, UA: 13 /HPF — ABNORMAL HIGH (ref 0–5)
pH, Urine: 6.5 (ref 5.0–8.0)

## 2013-09-27 LAB — COMPREHENSIVE METABOLIC PANEL
ALT: 13 U/L (ref 10–40)
AST: 11 IU/L — ABNORMAL LOW (ref 15–37)
Albumin: 4.2 GM/DL (ref 3.4–5.0)
Alkaline Phosphatase: 86 IU/L (ref 40–129)
Anion Gap: 15 (ref 4–16)
BUN: 18 MG/DL (ref 6–23)
CO2: 19 MMOL/L — ABNORMAL LOW (ref 21–32)
Calcium: 9 MG/DL (ref 8.3–10.6)
Chloride: 106 mMol/L (ref 99–110)
Creatinine: 0.6 MG/DL (ref 0.6–1.1)
GFR African American: >60 mL/min/{1.73_m2}
GFR Non-African American: >60 mL/min/{1.73_m2}
Glucose, Fasting: 98 MG/DL (ref 70–99)
Potassium: 3.8 MMOL/L (ref 3.5–5.1)
Sodium: 140 MMOL/L (ref 136–145)
Total Bilirubin: 0.3 MG/DL (ref 0.0–1.0)
Total Protein: 6.6 GM/DL (ref 6.4–8.2)

## 2013-09-27 LAB — CBC WITH AUTO DIFFERENTIAL
Basophils %: 0.3 % (ref 0–1)
Basophils Absolute: 0 10*3/uL
Eosinophils %: 1 % (ref 0–3)
Eosinophils Absolute: 0.1 10*3/uL
Hematocrit: 42.3 % (ref 37–47)
Hemoglobin: 14.6 GM/DL (ref 12.5–16.0)
Lymphocytes %: 24 % (ref 24–44)
Lymphocytes Absolute: 2.6 10*3/uL
MCH: 29.9 PG (ref 27–31)
MCHC: 34.4 % (ref 32.0–36.0)
MCV: 87 FL (ref 78–100)
MPV: 8.4 FL (ref 7.5–11.1)
Monocytes %: 5.4 % — ABNORMAL HIGH (ref 0–4)
Monocytes Absolute: 0.6 10*3/uL
Platelets: 294 10*3/uL (ref 140–440)
RBC: 4.86 10*6/uL (ref 4.2–5.4)
RDW: 13.9 % (ref 11.7–14.9)
Segs Absolute: 7.5 10*3/uL
Segs Relative: 69.3 % — ABNORMAL HIGH (ref 36–66)
WBC: 10.9 10*3/uL — ABNORMAL HIGH (ref 4.0–10.5)

## 2013-09-27 LAB — CELL COUNT WITH DIFFERENTIAL, CSF
Lymphs, CSF: 67 %
Other Cells, CSF: NONE SEEN CU MM
RBC, CSF: 139 CU MM — ABNORMAL HIGH
Segs: 9 %
WBC, CSF: 21 CU MM — ABNORMAL HIGH (ref 0–5)

## 2013-09-27 LAB — GLUCOSE, CSF: Glucose, CSF: 54 MG/DL (ref 40–75)

## 2013-09-27 LAB — PROTEIN, CSF: Protein, CSF: 22 MG/DL (ref 15–45)

## 2013-09-27 MED ORDER — HYDROMORPHONE HCL PF 1 MG/ML IJ SOLN
1 MG/ML | INTRAMUSCULAR | Status: DC | PRN
Start: 2013-09-27 — End: 2013-09-27
  Administered 2013-09-27 (×3): 1 mg via INTRAVENOUS

## 2013-09-27 MED ORDER — ACETAZOLAMIDE ER 500 MG PO CP12
500 MG | ORAL_CAPSULE | Freq: Two times a day (BID) | ORAL | Status: DC
Start: 2013-09-27 — End: 2014-08-03

## 2013-09-27 MED ORDER — HYDROMORPHONE HCL PF 1 MG/ML IJ SOLN
1 MG/ML | Freq: Once | INTRAMUSCULAR | Status: AC
Start: 2013-09-27 — End: 2013-09-27
  Administered 2013-09-27: 22:00:00 0.5 mg via INTRAVENOUS

## 2013-09-27 MED ORDER — DEXAMETHASONE SODIUM PHOSPHATE 4 MG/ML IJ SOLN
4 MG/ML | Freq: Once | INTRAMUSCULAR | Status: AC
Start: 2013-09-27 — End: 2013-09-27
  Administered 2013-09-27: 22:00:00 10 mg via INTRAVENOUS

## 2013-09-27 MED ORDER — ONDANSETRON HCL 4 MG/2ML IJ SOLN
4 MG/2ML | INTRAMUSCULAR | Status: DC | PRN
Start: 2013-09-27 — End: 2013-09-27
  Administered 2013-09-27 (×2): 4 mg via INTRAVENOUS

## 2013-09-27 MED FILL — HYDROMORPHONE HCL PF 1 MG/ML IJ SOLN: 1 MG/ML | INTRAMUSCULAR | Qty: 1

## 2013-09-27 MED FILL — ONDANSETRON HCL 4 MG/2ML IJ SOLN: 4 MG/2ML | INTRAMUSCULAR | Qty: 2

## 2013-09-27 MED FILL — DEXAMETHASONE SODIUM PHOSPHATE 4 MG/ML IJ SOLN: 4 MG/ML | INTRAMUSCULAR | Qty: 3

## 2013-09-27 NOTE — ED Notes (Signed)
Printed facesheet and transfer form. Transfer form given to Dr Wayne Sever.    Jessica Krause  09/27/13 1150

## 2013-09-27 NOTE — Discharge Instructions (Signed)
Headache: After Your Visit  Your Care Instructions     Headaches have many possible causes. Most headaches aren't a sign of a more serious problem, and they will get better on their own. Home treatment may help you feel better faster.  The doctor has checked you carefully, but problems can develop later. If you notice any problems or new symptoms, get medical treatment right away.  Follow-up care is a key part of your treatment and safety. Be sure to make and go to all appointments, and call your doctor if you are having problems. It's also a good idea to know your test results and keep a list of the medicines you take.  How can you care for yourself at home?   Do not drive if you have taken a prescription pain medicine.   Rest in a quiet, dark room until your headache is gone. Close your eyes and try to relax or go to sleep. Don't watch TV or read.   Put a cold, moist cloth or cold pack on the painful area for 10 to 20 minutes at a time. Put a thin cloth between the cold pack and your skin.   Use a warm, moist towel or a heating pad set on low to relax tight shoulder and neck muscles.   Have someone gently massage your neck and shoulders.   Take pain medicines exactly as directed.   If the doctor gave you a prescription medicine for pain, take it as prescribed.   If you are not taking a prescription pain medicine, ask your doctor if you can take an over-the-counter medicine.   Be careful not to take pain medicine more often than the instructions allow, because you may get worse or more frequent headaches when the medicine wears off.   Do not ignore new symptoms that occur with a headache, such as a fever, weakness or numbness, vision changes, or confusion. These may be signs of a more serious problem.  To prevent headaches   Keep a headache diary so you can figure out what triggers your headaches. Avoiding triggers may help you prevent headaches. Record when each headache began, how long it lasted, and  what the pain was like (throbbing, aching, stabbing, or dull). Write down any other symptoms you had with the headache, such as nausea, flashing lights or dark spots, or sensitivity to bright light or loud noise. Note if the headache occurred near your period. List anything that might have triggered the headache, such as certain foods (chocolate, cheese, wine) or odors, smoke, bright light, stress, or lack of sleep.   Find healthy ways to deal with stress. Headaches are most common during or right after stressful times. Take time to relax before and after you do something that has caused a headache in the past.   Try to keep your muscles relaxed by keeping good posture. Check your jaw, face, neck, and shoulder muscles for tension, and try relaxing them. When sitting at a desk, change positions often, and stretch for 30 seconds each hour.   Get plenty of sleep and exercise.   Eat regularly and well. Long periods without food can trigger a headache.   Treat yourself to a massage. Some people find that regular massages are very helpful in relieving tension.   Limit caffeine by not drinking too much coffee, tea, or soda. But don't quit caffeine suddenly, because that can also give you headaches.   Reduce eyestrain from computers by blinking frequently and looking away   from the computer screen every so often. Make sure you have proper eyewear and that your monitor is set up properly, about an arm's length away.   Seek help if you have depression or anxiety. Your headaches may be linked to these conditions. Treatment can both prevent headaches and help with symptoms of anxiety or depression.  When should you call for help?  Call 911 anytime you think you may need emergency care. For example, call if:   You have signs of a stroke. These may include:   Sudden numbness, paralysis, or weakness in your face, arm, or leg, especially on only one side of your body.   Sudden vision changes.   Sudden trouble  speaking.   Sudden confusion or trouble understanding simple statements.   Sudden problems with walking or balance.   A sudden, severe headache that is different from past headaches.  Call your doctor now or seek immediate medical care if:   You have a new or worse headache.   Your headache gets much worse.   Where can you learn more?   Go to [email or web address] and sign in to your MyChart account. Enter M271 in the Search Health Information box to learn more about "Headache: After Your Visit."    If you do not have an account, please click on the "Sign Up Now" link.      2006-2015 [employer]. Care instructions adapted under license by [facility]. This care instruction is for use with your licensed healthcare professional. If you have questions about a medical condition or this instruction, always ask your healthcare professional. [employer] disclaims any warranty or liability for your use of this information.  Content Version: 10.5.422740; Current as of: March 28, 2013

## 2013-09-27 NOTE — ED Notes (Signed)
Patient states her headache is feeling much better after LP, states her head feels like a "mild migraine", states like her eyes are "back in her head". Patient states her eyes feel "tender"    Eugenie Birks, RN  09/27/13 1655

## 2013-09-27 NOTE — ED Provider Notes (Signed)
6:35 PM  Burney Gauze was checked out to me by Dr. Wayne Sever. Please see his/her initial documentation for details of the patient's ED presentation, physical exam and completed studies.    In brief, Jessica Krause is a 28 y.o. female that presents For headache.  Has a recent diagnosis of pseudotumor cerebri and was started on acetazolamide.  She did undergo a tap at [facility] yesterday but they were reportedly able to remove a very limited amount of CSF.     I have reviewed and interpreted all of the currently available lab results from this visit (if applicable):  Results for orders placed during the hospital encounter of 09/27/13   CBC WITH AUTO DIFFERENTIAL       Result Value Ref Range    WBC 10.9 (*) 4.0 - 10.5 K/CU MM    RBC 4.86  4.2 - 5.4 M/CU MM    Hemoglobin 14.6  12.5 - 16.0 GM/DL    Hematocrit 16.1  37 - 47 %    MCV 87.0  78 - 100 FL    MCH 29.9  27 - 31 PG    MCHC 34.4  32.0 - 36.0 %    RDW 13.9  11.7 - 14.9 %    Platelets 294  140 - 440 K/CU MM    MPV 8.4  7.5 - 11.1 FL    Differential Type AUTOMATED DIFFERENTIAL      Segs Relative 69.3 (*) 36 - 66 %    Lymphocytes Relative 24.0  24 - 44 %    Monocytes Relative 5.4 (*) 0 - 4 %    Eosinophils Relative Percent 1.0  0 - 3 %    Basophils Relative 0.3  0 - 1 %    Segs Absolute 7.5      Lymphocytes Absolute 2.6      Monocytes Absolute 0.6      Eosinophils Absolute 0.1      Basophils Absolute 0.0     URINALYSIS WITH MICROSCOPIC       Result Value Ref Range    Color, UA YELLOW  UYELL    Clarity, UA HAZY (*) CLEAR    Volume, (UVOL) 12  10 - 12 ML    Glucose, Urine NEGATIVE  NEG MG/DL    Bilirubin Urine NEGATIVE  NEG MG/DL    Ketones, Urine NEGATIVE  NEG MG/DL    Specific Gravity, UA 1.013  1.001 - 1.035    Blood, Urine NEGATIVE  NEG    pH, Urine 6.5  5.0 - 8.0    Protein, UA NEGATIVE  NEG MG/DL    Urobilinogen, Urine 0.2  0.2 - 1.0 EU/DL    Nitrite Urine, Quantitative NEGATIVE  NEG    Leukocyte Esterase, Urine NEGATIVE  NEG    RBC, UA 5  0 - 6 /HPF    WBC, UA 13  (*) 0 - 5 /HPF    Epi Cells 9      Cast Type NO CAST FORMS SEEN  NCFS /LPF    Bacteria, UA OCCASIONAL (*) NEG /HPF    Crystal Type NONE SEEN  NEG /HPF    Mucus, UA RARE     COMPREHENSIVE METABOLIC PANEL       Result Value Ref Range    Sodium 140  136 - 145 MMOL/L    Potassium 3.8  3.5 - 5.1 MMOL/L    Chloride 106  99 - 110 mMol/L    CO2 19 (*) 21 - 32 MMOL/L  BUN 18  6 - 23 MG/DL    CREATININE 0.6  0.6 - 1.1 MG/DL    Glucose, Fasting 98  70 - 99 MG/DL    Calcium 9.0  8.3 - 64.3 MG/DL    Alb 4.2  3.4 - 5.0 GM/DL    Total Protein 6.6  6.4 - 8.2 GM/DL    Total Bilirubin 0.3  0.0 - 1.0 MG/DL    ALT 13  10 - 40 U/L    AST 11 (*) 15 - 37 IU/L    Alkaline Phosphatase 86  40 - 129 IU/L    GFR Non-African American >60      GFR African American >60      Anion Gap 15  4 - 16   PREGNANCY, URINE       Result Value Ref Range    Pregnancy, Urine NEGATIVE      Specific Gravity, Urine 1.013  1.001 - 1.035   GLUCOSE, CSF       Result Value Ref Range    Glucose, CSF 54  40 - 75 MG/DL   PROTEIN, CSF       Result Value Ref Range    Protein, CSF 22  15 - 45 MG/DL   CSF CELL COUNT WITH DIFFERENTIAL       Result Value Ref Range    RBC, CSF 139 (*) 0 CU MM    WBC, CSF 21 (*) 0 - 5 CU MM    Other Cells, CSF OTHER CELL TYPE NOT SEEN      Segs 9      Lymphs, CSF 67      Other Cells, CSF MONOCYTES 24       MDM:  Patient did return from fluoroscopy guided lumbar puncture.  The opening pressure was greater than 40 per nursing report to myself. I have reassessed the patient, she is sitting upright in bed eating.  States that she feels much improved. She has a mild residual headache but states that this is significantly less then prior to the procedure.    This time, I will discharge to home as discussed with Dr. Wayne Sever.  Patient understands return precautions as well as the anticipated plan of care.  She is given return precautions.  Follow-up with neurology and ophthalmology as previously advised.    Final Impression:  1. Headache(784.0)         (Please note that portions of this note may have been completed with a voice recognition program. Efforts were made to edit the dictations but occasionally words are mis-transcribed.)    Lupita Shutter, DO         Lupita Shutter, DO  09/27/13 [place] Fruit

## 2013-09-27 NOTE — ED Notes (Signed)
facesheet faxed to [phone]    Jessica Krause  09/27/13 1150

## 2013-09-27 NOTE — ED Notes (Signed)
Patient updated on plan of care, husband at bedside. Denies further needs at this time    Eugenie Birks, RN  09/27/13 [id number]

## 2013-09-27 NOTE — ED Notes (Signed)
Bed: ED25  Expected date:   Expected time:   Means of arrival:   Comments:  EMS

## 2013-09-27 NOTE — ED Notes (Signed)
Patient is requesting to have pain medication, states she would like to have half a dose of medication she was receiving before. This nurse informed patient that I will notify the dr     Karin LieuSara B Jace Dowe, RN  09/27/13 1721

## 2013-09-27 NOTE — ED Notes (Signed)
Patient to IR via cart    [name], RN  09/27/13 [phone]

## 2013-09-27 NOTE — ED Notes (Signed)
Pillow given, patient updated on plan of care. Call light in reach. Denies further needs at this time.    [name], RN  09/27/13 1754

## 2013-09-27 NOTE — ED Provider Notes (Signed)
[facility]  eMERGENCY dEPARTMENT eNCOUnter      Pt Name: Jessica Krause  MRN: [id number]  Birthdate 08-15-1985  Date of evaluation: 09/27/2013  Provider: Effie Berkshire, MD    CHIEF COMPLAINT       Chief Complaint   Patient presents with   ??? Headache     was seen and released from [facility] ED yesterday         HISTORY OF PRESENT ILLNESS  (Location/Symptom, Timing/Onset, Context/Setting, Quality, Duration, Modifying Factors, Severity.)   Jessica Krause is a 28 y.o. female who presents to the emergency department with a chief complaint of headache, with an underlying history of pseudotumor cerebri.  The patient does she was seen yesterday at [facility], had a neurosurgery evaluation, lumbar puncture performed, was sent home afterwards.  She does she was started on Fioricet and newly prescribed acetazolamide of which she just started taking yesterday.  The patient presents today with continued headache, generalized in nature, worse with loud noises, lites, she notes the headache does radiate from the frontal area to the posterior occipital area down to her neck.  Denies any numbness or tingling.  Denies fever, chills she does note nausea without vomiting.  Patient denies chest pain, shortness of breath, abdominal pain, urinary/bowel complaints.  She does note a history of pineal gland cyst as well as pseudotumor cerebri.  She does not have a neurologist in the area which she follows with.  She states she does also see Dr. Inda Merlin in [facility] for neurology follow-up.  The patient denies any other acute systemic complaints at this time.       Nursing Notes were reviewed.    REVIEW OF SYSTEMS    (2-9 systems for level 4, 10 or more for level 5)   ROS:  General:  No fevers, no chills, no weakness  Eyes:  No recent vison changes, no discharge  ENT:  No sore throat, no nasal congestion, no hearing changes  Cardiovascular:  No chest pain, no palpitations  Respiratory:  No shortness of  breath, no cough, no wheezing  Gastrointestinal:  No pain, no nausea, no vomiting, no diarrhea  Musculoskeletal:  No muscle pain, no joint pain  Skin:  No rash, no pruritis, no easy bruising  Neurologic:  No speech problems, + headache, no extremity numbness, no extremity tingling, + lower extremity weakness  Psychiatric:  No anxiety  Genitourinary:  No dysuria, no hematuria  Endocrine:  No unexpected weight gain, no unexpected weight loss  Extremities:  no edema, no pain    Except as noted above the remainder of the review of systems was reviewed and negative.       PAST MEDICAL HISTORY     Past Medical History   Diagnosis Date   ??? Asthma    ??? Migraine    ??? Arthritis    ??? IBS (irritable bowel syndrome)      "was dx prior to gallbladder surgery but no trouble since the gb removed"   ??? Bipolar 1 disorder (HCC)    ??? Anesthesia      "I wake up freaking out after surgery"   ??? Polycystic ovarian syndrome    ??? Claustrophobia 09/16/2013     MRI BRAIN   ??? Pseudotumor cerebri          SURGICAL HISTORY       Past Surgical History   Procedure Laterality Date   ??? Anterior cruciate ligament repair  right knee 2010(pc)   ??? Cholecystectomy  age 16-2004   ??? Hysterectomy  age 79     "removed everything but the ovaries and had to repair my bladder"   ??? Dental surgery       "wisdom teeth removed age 20, put to sleep"   ??? Carpal tunnel release  12/21/10     right hand   ??? Carpal tunnel release  01/18/11     left carpal tunnel release          CURRENT MEDICATIONS       Previous Medications    ACETAZOLAMIDE (DIAMOX) 125 MG TABLET    Take 125 mg by mouth 3 times daily Take 2 tabs BID for 2 days then take 2 tabs TID for 3 days    BUTALBITAL-ASPIRIN-CAFFEINE (FIORINAL) 50-325-40 MG CAPSULE    Take 1 capsule by mouth every 4 hours as needed for Migraine    IBUPROFEN (ADVIL;MOTRIN) 800 MG TABLET    Take 800 mg by mouth every 6 hours as needed for Pain       ALLERGIES     Inderal; Cephalexin; Pcn; and Amoxicillin    FAMILY HISTORY        Family History   Problem Relation Age of Onset   ??? Asthma Mother    ??? Mental Illness Mother    ??? Heart Disease Father      MI   ??? High Blood Pressure Father    ??? Asthma Daughter           SOCIAL HISTORY       History     Social History   ??? Marital Status: Married     Spouse Name: N/A     Number of Children: N/A   ??? Years of Education: N/A     Social History Main Topics   ??? Smoking status: Former Smoker -- 0.50 packs/day for 10 years     Quit date: 12/06/2011   ??? Smokeless tobacco: Never Used   ??? Alcohol Use: Yes      Comment: social, quit over a year ago   ??? Drug Use: No   ??? Sexual Activity:     Partners: Male     Other Topics Concern   ??? None     Social History Narrative         PHYSICAL EXAM    (up to 7 for level 4, 8 or more for level 5)   ED Triage Vitals   BP Temp Temp Source Pulse Resp SpO2 Height Weight   09/27/13 1115 09/27/13 1115 09/27/13 1115 09/27/13 1115 09/27/13 1115 09/27/13 1115 09/27/13 1115 09/27/13 1115   122/73 mmHg 97.7 ??F (36.5 ??C) Oral 66 16 99 %  (1.727 m) 280 lb (127.007 kg)       Physical Exam  General :Patient is awake, alert, oriented, she is in mild distress, nontoxic appearing  HEENT: Pupils are equally round and reactive to light, EOMI, conjunctivae clear, sclerae white, there is no injection no icterus.  Oral mucosa is moist no exudate. Uvula is midline  Neck: Neck is supple, there is generalized neck pain with neck flexion and limited range of motion, trachea midline  Cardiac: Heart regular rate, rhythm, no murmurs, rubs, or gallops  Lungs: Lungs are clear to auscultation, there is no wheezing, rhonchi, or rales. There is no use of accessory muscles.  Abdomen: Abdomen is soft, nontender, nondistended. There is no firm or pulsatile masses, no rebound rigidity or guarding.  Musculoskeletal: 5 out of 5 strength in all 4 extremities.  No focal muscle deficits are appreciated  Neuro: Motor intact, sensory intact, level of consciousness is normal, cerebellar function is normal,  reflexes are grossly normal. No evidence of incontinence or loss of bowel or bladder function, no saddle anesthesia noted   Dermatology: Skin is warm and dry  Lymphatic: There is no submandibular axillary or inguinal adenopathy appreciated.  Psych: Mentation is grossly normal, cognition is grossly normal. Affect is appropriate.      DIAGNOSTIC RESULTS     EKG: All EKG's are interpreted by the Emergency Department Physician who either signs or Co-signs this chart in the absence of a cardiologist.        RADIOLOGY:   Non-plain film images such as CT, Ultrasound and MRI are read by the radiologist. Plain radiographic images are visualized and preliminarily interpreted by the emergency physician with the below findings:         Radiologist's Report Reviewed:  IR FLUORO GUIDED LUMBAR PUNCTURE    (Results Pending)   IR SPINAL PUNCTURE LUMBAR DIAG    (Results Pending)         ED BEDSIDE ULTRASOUND:   Performed by ED Physician - none    LABS:  Labs Reviewed   CBC WITH AUTO DIFFERENTIAL - Abnormal; Notable for the following:     WBC 10.9 (*)     Segs Relative 69.3 (*)     Monocytes Relative 5.4 (*)     All other components within normal limits    Narrative:     Performed @ [facility], [address] [place], [place] [address]   URINALYSIS WITH MICROSCOPIC - Abnormal; Notable for the following:     Clarity, UA HAZY (*)     WBC, UA 13 (*)     Bacteria, UA OCCASIONAL (*)     All other components within normal limits    Narrative:     SQUAMOUS  Performed @ [facility], [address] [place], [place] [address]   COMPREHENSIVE METABOLIC PANEL - Abnormal; Notable for the following:     CO2 19 (*)     AST 11 (*)     All other components within normal limits    Narrative:     Performed @ [facility], [address], [place], [place] [address]   CSF CULTURE   GRAM STAIN   PREGNANCY, URINE    Narrative:     HCG METHOD LIMITATIONS:  Very dilute  specimens, as indicated  by low specific gravity, may have  insufficient concentration of HCG  to bring about a positive result.           Performed @ [facility], [address], [place], [place] [address]   GLUCOSE, CSF   PROTEIN, CSF   CSF CELL COUNT WITH DIFFERENTIAL       I have reviewed and interpreted all of the currently available lab results from this visit (if applicable):  Results for orders placed during the hospital encounter of 09/27/13   CBC WITH AUTO DIFFERENTIAL       Result Value Ref Range    WBC 10.9 (*) 4.0 - 10.5 K/CU MM    RBC 4.86  4.2 - 5.4 M/CU MM    Hemoglobin 14.6  12.5 - 16.0 GM/DL    Hematocrit 29.5  37 - 47 %    MCV 87.0  78 - 100 FL  MCH 29.9  27 - 31 PG    MCHC 34.4  32.0 - 36.0 %    RDW 13.9  11.7 - 14.9 %    Platelets 294  140 - 440 K/CU MM    MPV 8.4  7.5 - 11.1 FL    Differential Type AUTOMATED DIFFERENTIAL      Segs Relative 69.3 (*) 36 - 66 %    Lymphocytes Relative 24.0  24 - 44 %    Monocytes Relative 5.4 (*) 0 - 4 %    Eosinophils Relative Percent 1.0  0 - 3 %    Basophils Relative 0.3  0 - 1 %    Segs Absolute 7.5      Lymphocytes Absolute 2.6      Monocytes Absolute 0.6      Eosinophils Absolute 0.1      Basophils Absolute 0.0     URINALYSIS WITH MICROSCOPIC       Result Value Ref Range    Color, UA YELLOW  UYELL    Clarity, UA HAZY (*) CLEAR    Volume, (UVOL) 12  10 - 12 ML    Glucose, Urine NEGATIVE  NEG MG/DL    Bilirubin Urine NEGATIVE  NEG MG/DL    Ketones, Urine NEGATIVE  NEG MG/DL    Specific Gravity, UA 1.013  1.001 - 1.035    Blood, Urine NEGATIVE  NEG    pH, Urine 6.5  5.0 - 8.0    Protein, UA NEGATIVE  NEG MG/DL    Urobilinogen, Urine 0.2  0.2 - 1.0 EU/DL    Nitrite Urine, Quantitative NEGATIVE  NEG    Leukocyte Esterase, Urine NEGATIVE  NEG    RBC, UA 5  0 - 6 /HPF    WBC, UA 13 (*) 0 - 5 /HPF    Epi Cells 9      Cast Type NO CAST FORMS SEEN  NCFS /LPF    Bacteria, UA OCCASIONAL (*) NEG /HPF    Crystal Type NONE SEEN  NEG /HPF     Mucus, UA RARE     COMPREHENSIVE METABOLIC PANEL       Result Value Ref Range    Sodium 140  136 - 145 MMOL/L    Potassium 3.8  3.5 - 5.1 MMOL/L    Chloride 106  99 - 110 mMol/L    CO2 19 (*) 21 - 32 MMOL/L    BUN 18  6 - 23 MG/DL    CREATININE 0.6  0.6 - 1.1 MG/DL    Glucose, Fasting 98  70 - 99 MG/DL    Calcium 9.0  8.3 - 16.1 MG/DL    Alb 4.2  3.4 - 5.0 GM/DL    Total Protein 6.6  6.4 - 8.2 GM/DL    Total Bilirubin 0.3  0.0 - 1.0 MG/DL    ALT 13  10 - 40 U/L    AST 11 (*) 15 - 37 IU/L    Alkaline Phosphatase 86  40 - 129 IU/L    GFR Non-African American >60      GFR African American >60      Anion Gap 15  4 - 16   PREGNANCY, URINE       Result Value Ref Range    Pregnancy, Urine NEGATIVE      Specific Gravity, Urine 1.013  1.001 - 1.035        All other labs were within normal range or not returned as of this dictation.  EMERGENCY DEPARTMENT COURSE and DIFFERENTIAL DIAGNOSIS/MDM:   Vitals:    Filed Vitals:    09/27/13 1400 09/27/13 1430 09/27/13 1502 09/27/13 1531   BP: 100/66 116/87 119/48 133/58   Pulse: 63 64 68 62   Temp:       TempSrc:       Resp: Height:       Weight:       SpO2: 97% 97% 100% 98%       This a 28 year old female with a history of recurrent migraines with underlying pseudotumor cerebri, and a pineal gland cyst with worsening headache and concerns and she was just seen yesterday by neurosurgery at [facility].  The patient on arrival was in moderate to severe distress, did give her dose of Dilaudid and Zofran, which did help her symptoms patient she was able to talk to me afterwards, notes she does follow with a local neurologist, Dr.Hickman.  I did discuss results with the patient's neurosurgeon who saw her yesterday to [facility], and they note that he did have a open pressure of 29 there were not able to drink a lot of fluid off at that time, no other acute changes noted prior to her discharge home.  They state she should follow-up with a ophthalmologist as well as  a neurologist for further workup and evaluation and medication adjustment as needed.  I did then call patient's neurologist, Dr. Inda Merlin, and she notes patient was recently started on acetazolamide, she may need to up her dosage, she is now currently taking 250 mg twice a day, will need 500 mg twice a day.  She recommended the patient receive another lumbar puncture for possible therapeutic CSF drainage and see if it helps her symptoms.  I did discuss this with diagnostic radiology, they will take the patient for a lumbar puncture, CSF studies were also obtained.      CMP without any acute abnormality is, urinalysis with 13 note BCs, occasional bacteria, pregnancy negative.  CBC with a white count 10.9, hemoglobin stable.  We'll reevaluate her after her lumbar puncture, disposition pending at this time patient to follow-up with her neurologist in the next 1-2 days and ophtho as previously planned.  Or return to ED immediately if she has any worsening symptoms or further concerns, without fail.    Patient has been signed out to the oncoming ER physician who will reevaluate after lumbar puncture with disposition pending.    CONSULTS:  IP CONSULT TO NEUROSURGERY  IP CONSULT TO NEUROLOGY  IP CONSULT TO INTERVENTIONAL RADIOLOGY  IP CONSULT TO DIAGNOSTIC RADIOLOGY    PROCEDURES:  Procedures    CRITICAL CARE TIME    Total Critical Care time was 0 minutes, excluding separately reportable procedures.   There was a high probability of clinically significant/life threatening deterioration in the patient's condition which required my urgent intervention.      FINAL IMPRESSION    No diagnosis found.      DISPOSITION/PLAN   DISPOSITION     PATIENT REFERRED TO:  No follow-up provider specified.    DISCHARGE MEDICATIONS:  New Prescriptions    ACETAZOLAMIDE (DIAMOX) 500 MG CAPSULE    Take 1 capsule by mouth 2 times daily       Comment: Please note this report has been produced using speech recognition software and may contain errors  related to that system including errors in grammar, punctuation, and spelling, as well as words and phrases that may  be inappropriate. If there are any questions or concerns please feel free to contact the dictating provider for clarification.    Effie Berkshire, MD  Attending Emergency Physician            Effie Berkshire, MD  09/27/13 [phone]

## 2013-09-27 NOTE — ED Notes (Signed)
Patient ambulated out of ED with gait steady. Denies further needs. Husband at bedside    Karin LieuSara B Zyen Triggs, RN  09/27/13 [phone]

## 2013-09-29 ENCOUNTER — Inpatient Hospital Stay: Admit: 2013-09-29 | Discharge: 2013-09-30 | Attending: Emergency Medicine

## 2013-09-29 LAB — URINALYSIS
Bilirubin Urine: NEGATIVE MG/DL
Blood, Urine: NEGATIVE
Cast Type: NONE SEEN /LPF
Epithelial Cells, UA: 1 /HPF
Glucose, Urine: NEGATIVE MG/DL
Ketones, Urine: NEGATIVE MG/DL
Leukocyte Esterase, Urine: NEGATIVE
Mucus, UA: NEGATIVE
Nitrite Urine, Quantitative: NEGATIVE
Protein, UA: NEGATIVE MG/DL
RBC, UA: <1 /HPF (ref 0–6)
Specific Gravity, UA: 1.016 (ref 1.001–1.035)
Urobilinogen, Urine: 0.2 EU/DL (ref 0.2–1.0)
Volume, (UVOL): 12 ML (ref 10–12)
WBC, UA: 1 /HPF (ref 0–5)
pH, Urine: 7 (ref 5.0–8.0)

## 2013-09-29 LAB — CBC WITH AUTO DIFFERENTIAL
Basophils %: 0.5 % (ref 0–1)
Basophils Absolute: 0.1 10*3/uL
Eosinophils %: 1 % (ref 0–3)
Eosinophils Absolute: 0.1 10*3/uL
Hematocrit: 42.4 % (ref 37–47)
Hemoglobin: 15 GM/DL (ref 12.5–16.0)
Lymphocytes %: 23.7 % — ABNORMAL LOW (ref 24–44)
Lymphocytes Absolute: 2.9 10*3/uL
MCH: 30.6 PG (ref 27–31)
MCHC: 35.4 % (ref 32.0–36.0)
MCV: 86.3 FL (ref 78–100)
MPV: 7.8 FL (ref 7.5–11.1)
Monocytes %: 5.5 % — ABNORMAL HIGH (ref 0–4)
Monocytes Absolute: 0.7 10*3/uL
Platelets: 291 10*3/uL (ref 140–440)
RBC: 4.91 10*6/uL (ref 4.2–5.4)
RDW: 14.2 % (ref 11.7–14.9)
Segs Absolute: 8.5 10*3/uL
Segs Relative: 69.3 % — ABNORMAL HIGH (ref 36–66)
WBC: 12.2 10*3/uL — ABNORMAL HIGH (ref 4.0–10.5)

## 2013-09-29 LAB — COMPREHENSIVE METABOLIC PANEL
ALT: 20 U/L (ref 10–40)
AST: 12 IU/L — ABNORMAL LOW (ref 15–37)
Albumin: 4.3 GM/DL (ref 3.4–5.0)
Alkaline Phosphatase: 94 IU/L (ref 40–129)
Anion Gap: 14 (ref 4–16)
BUN: 22 MG/DL (ref 6–23)
CO2: 20 MMOL/L — ABNORMAL LOW (ref 21–32)
Calcium: 8.9 MG/DL (ref 8.3–10.6)
Chloride: 107 mMol/L (ref 99–110)
Creatinine: 0.7 MG/DL (ref 0.6–1.1)
GFR African American: >60 mL/min/{1.73_m2}
GFR Non-African American: >60 mL/min/{1.73_m2}
Glucose, Fasting: 85 MG/DL (ref 70–99)
Potassium: 3.5 MMOL/L (ref 3.5–5.1)
Sodium: 141 MMOL/L (ref 136–145)
Total Bilirubin: 0.5 MG/DL (ref 0.0–1.0)
Total Protein: 6.7 GM/DL (ref 6.4–8.2)

## 2013-09-29 LAB — PREGNANCY, URINE
Pregnancy, Urine: NEGATIVE
Specific Gravity, Urine: 1.016 (ref 1.001–1.035)

## 2013-09-29 MED ORDER — SODIUM CHLORIDE 0.9 % IV BOLUS
0.9 % | Freq: Once | INTRAVENOUS | Status: AC
Start: 2013-09-29 — End: 2013-09-29
  Administered 2013-09-29: 21:00:00 1000 mL via INTRAVENOUS

## 2013-09-29 MED ORDER — LIDOCAINE HCL (PF) 1 % IJ SOLN
1 % | INTRAMUSCULAR | Status: DC
Start: 2013-09-29 — End: 2013-09-30

## 2013-09-29 MED ORDER — ONDANSETRON HCL 4 MG/2ML IJ SOLN
4 MG/2ML | INTRAMUSCULAR | Status: AC | PRN
Start: 2013-09-29 — End: 2013-09-29
  Administered 2013-09-29 (×2): 4 mg via INTRAVENOUS

## 2013-09-29 MED ORDER — HYDROMORPHONE HCL PF 1 MG/ML IJ SOLN
1 MG/ML | INTRAMUSCULAR | Status: DC | PRN
Start: 2013-09-29 — End: 2013-09-30
  Administered 2013-09-29: 04:00:00 0.5 mg via INTRAVENOUS
  Administered 2013-09-30: 02:00:00 1 mg via INTRAVENOUS
  Administered 2013-09-30: 02:00:00 0.5 mg via INTRAVENOUS

## 2013-09-29 MED ORDER — ONDANSETRON HCL 4 MG/2ML IJ SOLN
4 MG/2ML | Freq: Four times a day (QID) | INTRAMUSCULAR | Status: DC | PRN
Start: 2013-09-29 — End: 2013-09-30
  Administered 2013-09-29 – 2013-09-30 (×2): 4 mg via INTRAVENOUS

## 2013-09-29 MED ORDER — HYDROMORPHONE HCL PF 1 MG/ML IJ SOLN
1 MG/ML | INTRAMUSCULAR | Status: AC | PRN
Start: 2013-09-29 — End: 2013-09-29
  Administered 2013-09-29 (×4): 0.5 mg via INTRAVENOUS

## 2013-09-29 MED ORDER — ASPIRIN 81 MG PO CHEW
81 MG | Freq: Once | ORAL | Status: DC
Start: 2013-09-29 — End: 2013-09-29

## 2013-09-29 MED FILL — HYDROMORPHONE HCL PF 1 MG/ML IJ SOLN: 1 MG/ML | INTRAMUSCULAR | Qty: 1

## 2013-09-29 MED FILL — ONDANSETRON HCL 4 MG/2ML IJ SOLN: 4 MG/2ML | INTRAMUSCULAR | Qty: 2

## 2013-09-29 MED FILL — XYLOCAINE-MPF 1 % IJ SOLN: 1 % | INTRAMUSCULAR | Qty: 10

## 2013-09-29 MED FILL — SODIUM CHLORIDE 0.9 % IV SOLN: 0.9 % | INTRAVENOUS | Qty: 1000

## 2013-09-29 NOTE — ED Notes (Signed)
22:18 Lynden Ang called from [employer] states that the deversion has been lifted and that we can now arrange transport to [facility] ER -- Lynden Ang states that Dr Garnet Sierras is the accepting ER Physician -- notified Dr Perrin Maltese and Diannia Ruder RN     Jessica Krause Jessica Krause  09/29/13 2232

## 2013-09-29 NOTE — ED Provider Notes (Signed)
eMERGENCY dEPARTMENT eNCOUnter        CHIEF COMPLAINT    Chief Complaint   Patient presents with   ??? Headache     s/p spinal tap on 09/27/13 and 09/28/13   ??? Neck Pain       HPI    Jessica Krause is a 28 y.o. female who presents with gradual onset of a headache since the onset  last night.  The duration has been constant since the onset.  The quality of the headache is sharp. This headache is not the worst headache of the patient's life.  Pain is generalized. The patient has associated nausea along with some difficulty with blurred vision and trouble with her speech. The pain worsens with exposure to bright light.  She has been in the ER couple of occasions over the past 2 weeks and was recently diagnosed with pseudotumor.  She had a therapeutic LP performed by interventional radiology 2 days ago.  At that time her opening pressure was 45 and closing pressure 13.  She has seen by neurosurgery at [place] [facility].  No surgical intervention was recommended at this point.  When her symptoms recurred she called [facility] and was advised to come there but because of her severe headache she called EMS and was transported here.     REVIEW OF SYSTEMS    Neurologic: No LOC or stiff neck  Cardiac: No Chest Pain, No syncope  Respiratory: No cough or difficulty breathing  GI: No Bloody Stool or Diarrhea  GU: No Dysuria or Hematuria  General: No Fever  All other systems reviewed and are negative.    PAST MEDICAL & SURGICAL HISTORY    Past Medical History   Diagnosis Date   ??? Asthma    ??? Migraine    ??? Arthritis    ??? IBS (irritable bowel syndrome)      "was dx prior to gallbladder surgery but no trouble since the gb removed"   ??? Bipolar 1 disorder (HCC)    ??? Anesthesia      "I wake up freaking out after surgery"   ??? Polycystic ovarian syndrome    ??? Claustrophobia 09/16/2013     MRI BRAIN   ??? Pseudotumor cerebri      Past Surgical History   Procedure Laterality Date   ??? Anterior cruciate ligament repair       right knee 2010(pc)   ???  Cholecystectomy  age 31-2004   ??? Hysterectomy  age 59     "removed everything but the ovaries and had to repair my bladder"   ??? Dental surgery       "wisdom teeth removed age 54, put to sleep"   ??? Carpal tunnel release  12/21/10     right hand   ??? Carpal tunnel release  01/18/11     left carpal tunnel release        CURRENT MEDICATIONS    Current Outpatient Rx   Name  Route  Sig  Dispense  Refill   ??? ibuprofen (ADVIL;MOTRIN) 800 MG tablet    Oral    Take 800 mg by mouth every 6 hours as needed for Pain             ??? butalbital-aspirin-caffeine (FIORINAL) 50-325-40 MG capsule    Oral    Take 1 capsule by mouth every 4 hours as needed for Migraine             ??? acetaZOLAMIDE (DIAMOX) 500 MG capsule  Oral    Take 1 capsule by mouth 2 times daily    60 capsule    0         ALLERGIES    Allergies   Allergen Reactions   ??? Inderal [Propranolol Hcl] Anaphylaxis     Heart rate dropped, couldn't walk   ??? Cephalexin Diarrhea   ??? Pcn [Penicillins]    ??? Amoxicillin Rash     "bloody stool"       SOCIAL & FAMILY HISTORY    History     Social History   ??? Marital Status: Married     Spouse Name: N/A     Number of Children: N/A   ??? Years of Education: N/A     Social History Main Topics   ??? Smoking status: Former Smoker -- 0.50 packs/day for 10 years     Quit date: 12/06/2011   ??? Smokeless tobacco: Never Used   ??? Alcohol Use: Yes      Comment: social, quit over a year ago   ??? Drug Use: No   ??? Sexual Activity:     Partners: Male     Other Topics Concern   ??? None     Social History Narrative     Family History   Problem Relation Age of Onset   ??? Asthma Mother    ??? Mental Illness Mother    ??? Heart Disease Father      MI   ??? High Blood Pressure Father    ??? Asthma Daughter        PHYSICAL EXAM    VITAL SIGNS: BP 124/106 mmHg   Pulse 64   Temp(Src) 98.7 ??F (37.1 ??C) (Oral)   Resp 18   Ht  (1.727 m)   Wt 280 lb (127.007 kg)   BMI 42.58 kg/m2   SpO2 97%   Constitutional:  Well developed, well nourished, in acute distress   Eyes: Pupils  equally round and react to light, extraocular movement are intact  Vascular: No temporal artery tenderness  HENT:  Atraumatic, moist mucus membranes, airway patent  Neck: supple, no JVD   Respiratory:  Lungs Clear, no retractions   Cardiovascular:  Reg rate, no murmurs  GI:  Soft, nontender, normal bowel sounds  Musculoskeletal:  No edema, no acute deformities  Integument:  Well hydrated, no petechiae   Neurologic:  Alert & oriented, no slurred speech, normal gross motor, normal finger to nose test bilaterally, patellar reflexes equal bilaterally  Psych: Pleasant affect, no hallucinations    EKG   None    RADIOLOGY    CT SCAN HEAD: Results pending.    ED COURSE & MEDICAL DECISION MAKING    Pertinent Labs & Imaging studies reviewed and interpreted. (See chart for details).  Baseline laboratory studies were obtained and the pertinent abnormal findings are listed here.  The patient has received IV fluids along with IV Zofran and Dilaudid for control of her.  Interventional radiology was consult in and they performed a repeat LP yielded clear appearing fluid and an opening pressure of 12.  The patient has remained neurologically intact with no focal findings during more than 2 hours of observation and workup.  Was discussed with Dr. Charlean Sanfilippo, the neurosurgeon on-call at [facility].  He recommends transfer to the ER there for further evaluation by neurosurgery, neurology, and ophthalmology.  The ER is currently on diversion so they will notify us when it is open again for transfers and we will  transfer her over there for further evaluation.  We will obtain a repeat head CT at the recommendation of Dr. Charlean Sanfilippo.  We will also administer 1 L of IV normal saline bolus, with the idea that some of her headache today may be low pressure related rather than high pressure related.  Labs Reviewed   CBC WITH AUTO DIFFERENTIAL - Abnormal; Notable for the following:     WBC 12.2 (*)     Segs Relative 69.3 (*)      Lymphocytes Relative 23.7 (*)     Monocytes Relative 5.5 (*)     All other components within normal limits    Narrative:     (NOTE)  WBC RESULT IS A POSITIVE TRIGGER FOR SEPSIS PROTOCOL      Performed @ [facility], [address], [place], [place] [address]   COMPREHENSIVE METABOLIC PANEL - Abnormal; Notable for the following:     CO2 20 (*)     AST 12 (*)     All other components within normal limits    Narrative:     Performed @ [facility], [address] [place], [place] [address]   URINALYSIS - Abnormal; Notable for the following:     Clarity, UA HAZY (*)     Bacteria, UA RARE (*)     Crystal Type OCCASIONAL (*)     All other components within normal limits    Narrative:     SQUAMOUS  AMORPHOUS SEEN  Performed @ [facility], [address] [place], [place] [address]   PREGNANCY, URINE    Narrative:     HCG METHOD LIMITATIONS:  Very dilute specimens, as indicated  by low specific gravity, may have  insufficient concentration of HCG  to bring about a positive result.           Performed @ [facility], [address] [place], [place] [address]     See chart for details of medications given during the ED stay.    Filed Vitals:    09/29/13 1257   BP: 124/106   Pulse: 64   Temp: 98.7 ??F (37.1 ??C)   TempSrc: Oral   Resp: 18   Height: 5\' 8"  (1.727 m)   Weight: 280 lb (127.007 kg)   SpO2: 97%       Differential Diagnosis: Subarachnoid hemorrhage, Meningitis, Temporal arteritis, Pseudotumor Cerebri, Acute renal failure, Migraine, other    FINAL IMPRESSION    Headache  Pseudotumor        (Please note that this note was completed with a voice recognition program.  Every attempt was made to edit the dictations, but inevitably there remain words that are mis-transcribed.)      Mertha Finders, MD  09/29/13 [phone]

## 2013-09-29 NOTE — ED Notes (Signed)
21:32 verbal order per Diannia Ruder RN to call [facility] to check if this patient has a bed yet -- I called [facility] spoke to [place] she transferred me to [facility] and Lynden Ang states that Dr Charlean Sanfilippo had spoke with Dr Yvonna Alanis and that Dr Charlean Sanfilippo advised that this patient needs to be transferred to [facility] ER -- Lynden Ang states that we were told at that time that [facility] ER was on deversion and that they would call us when this deversion was lifted and that this patient would have to stay with [place] until that time -- notified Diannia Ruder RN    Marney Setting Jarmon Javid  09/29/13 2144

## 2013-09-29 NOTE — ED Notes (Signed)
To ct via gurney    Vladimir Faster, RN  09/29/13 1640

## 2013-09-29 NOTE — ED Notes (Signed)
Called Jessica Krause at 1305 in the fluoro dept, stated she has 2 ahead of this patient and would be approx 1 1/2 hrs    [name]  09/29/13 1306

## 2013-09-29 NOTE — ED Notes (Signed)
22:14 paged Hospitalist thru [employer]    Marney SettingBrenda X [facility]  09/29/13 2216

## 2013-09-29 NOTE — ED Notes (Signed)
Med trans here at this time to transport patient to [facility].     Georgena Spurling, RN  09/29/13 2255

## 2013-09-29 NOTE — ED Notes (Signed)
Patient waiting on transport to [facility]    Vladimir Faster, RN  09/29/13 [phone]

## 2013-09-29 NOTE — ED Notes (Signed)
Patient and patient husband updated on plan of care.     Georgena Spurling, RN  09/29/13 2144

## 2013-09-29 NOTE — ED Notes (Signed)
22:22 verbal order per Diannia Ruder RN to call for transport to [facility] ER  -- I called Med [facility] spoke to Odell and she gave me an eta of 23:00 - notified Garey Ham Dylin Breeden  09/29/13 2235

## 2013-09-29 NOTE — ED Notes (Signed)
Patient asking if she can take her prescription Diamox at this time. Dr. Perrin Maltese notified. Dr. Perrin Maltese states that patient may take her diamox at this time.     Georgena Spurling, RN  09/29/13 2144

## 2013-09-29 NOTE — ED Provider Notes (Signed)
Patient is a 28 year old female with recently diagnosed benign intracranial hypertension at [facility].  She is signed out to me as a patient who had an intractable headache and was to be transferred to [facility].  Patient has been here in the emergency department throughout my shift.  She continued to have intractable headache and vomiting.  We have continued to give her medications for symptomatically relief.  A call back to [facility] reveals that they are on diversion.  Will not be able to accept the patient to their facility tonight.  Patient will be admitted to the hospitalist service here until a bed is available.  Patient is aware of this change in the plan of care and is agreeable to proceed    Lupita Shutter, DO  09/29/13 2211

## 2013-09-29 NOTE — ED Notes (Signed)
Report given to Inetta Fermo, Charity fundraiser at [facility] Emergency Room.     Georgena Spurling, RN  09/29/13 2322

## 2013-09-30 LAB — CSF PROFILE
Glucose, CSF: 49 MG/DL (ref 40–75)
Lymphs, CSF: 85 %
Other Cells, CSF: 35 %
Protein, CSF: 24 MG/DL (ref 15–45)
RBC, CSF: 615 CU MM — ABNORMAL HIGH
Segs: 15 %
WBC, CSF: 6 CU MM — ABNORMAL HIGH (ref 0–5)

## 2013-09-30 LAB — CELL COUNT WITH DIFFERENTIAL, CSF
Lymphs, CSF: 90 %
Other Cells, CSF: NONE SEEN CU MM
RBC, CSF: 532 CU MM — ABNORMAL HIGH
Segs: 10 %
WBC, CSF: 5 CU MM (ref 0–5)

## 2013-09-30 MED FILL — HYDROMORPHONE HCL PF 1 MG/ML IJ SOLN: 1 MG/ML | INTRAMUSCULAR | Qty: 1

## 2013-09-30 MED FILL — ONDANSETRON HCL 4 MG/2ML IJ SOLN: 4 MG/2ML | INTRAMUSCULAR | Qty: 2

## 2013-10-01 LAB — CULTURE, CSF: Gram Smear: NONE SEEN

## 2013-10-02 ENCOUNTER — Inpatient Hospital Stay: Admit: 2013-10-02 | Discharge: 2013-10-03 | Attending: Family Medicine

## 2013-10-02 MED ORDER — METOCLOPRAMIDE HCL 5 MG/ML IJ SOLN
5 MG/ML | Freq: Once | INTRAMUSCULAR | Status: AC
Start: 2013-10-02 — End: 2013-10-02
  Administered 2013-10-02: 23:00:00 10 mg via INTRAVENOUS

## 2013-10-02 MED ORDER — METHYLPREDNISOLONE SODIUM SUCC 125 MG IJ SOLR
125 MG | Freq: Once | INTRAMUSCULAR | Status: AC
Start: 2013-10-02 — End: 2013-10-02
  Administered 2013-10-02: 23:00:00 125 mg via INTRAVENOUS

## 2013-10-02 MED ORDER — DIPHENHYDRAMINE HCL 50 MG/ML IJ SOLN
50 MG/ML | Freq: Once | INTRAMUSCULAR | Status: AC
Start: 2013-10-02 — End: 2013-10-02
  Administered 2013-10-02: 23:00:00 50 mg via INTRAVENOUS

## 2013-10-02 MED ORDER — SODIUM CHLORIDE 0.9 % IV BOLUS
0.9 % | Freq: Once | INTRAVENOUS | Status: AC
Start: 2013-10-02 — End: 2013-10-02
  Administered 2013-10-02: 23:00:00 1000 mL via INTRAVENOUS

## 2013-10-02 MED FILL — METOCLOPRAMIDE HCL 5 MG/ML IJ SOLN: 5 MG/ML | INTRAMUSCULAR | Qty: 2

## 2013-10-02 MED FILL — METHYLPREDNISOLONE SODIUM SUCC 125 MG IJ SOLR: 125 MG | INTRAMUSCULAR | Qty: 125

## 2013-10-02 MED FILL — DIPHENHYDRAMINE HCL 50 MG/ML IJ SOLN: 50 MG/ML | INTRAMUSCULAR | Qty: 1

## 2013-10-02 MED FILL — SODIUM CHLORIDE 0.9 % IV SOLN: 0.9 % | INTRAVENOUS | Qty: 1000

## 2013-10-02 NOTE — ED Provider Notes (Signed)
Triage Chief Complaint:   Headache    HOPI:  Jessica Krause is a 28 y.o. female that presents with ongoing headache that has been present for 3 days since her last spinal tap.  She states that this headache is different than her elevated cerebrospinal fluid pressure headaches.  This headache started after her last lumbar puncture.  It is significantly worse when she sits up.  It decreases when she lays flat.  It has a throbbing nature and she feels lightheaded when she sits up.  It is not the worst headache of her life noted it started in a thunderclap appearance.  She has no neck stiffness or nuchal rigidity.  She denies fevers and chills.  She denies URI symptoms.  She denies sinus or ear symptoms.  She denies any vision changes.  She was seen and evaluated here 3 days ago and by report had a lumbar puncture with a normal opening pressure and was sent to [facility] where she was seen in the emergency department and then discharged.    ROS:  General:  No fevers, no chills, no weakness  Eyes:  No recent vison changes, no discharge  ENT:  No sore throat, no nasal congestion, no hearing changes  Cardiovascular:  No chest pain, no palpitations  Respiratory:  No shortness of breath, no cough, no wheezing  Gastrointestinal:  No pain, no nausea, no vomiting, no diarrhea  Musculoskeletal:  No muscle pain, no joint pain  Skin:  No rash, no pruritis, no easy bruising  Neurologic:  No speech problems, no headache, no extremity numbness, no extremity tingling, no extremity weakness  Psychiatric:  No anxiety  Genitourinary:  No dysuria, no hematuria  Endocrine:  No unexpected weight gain, no unexpected weight loss  Extremities:  no edema, no pain    Past Medical History   Diagnosis Date   ??? Asthma    ??? Migraine    ??? Arthritis    ??? IBS (irritable bowel syndrome)      "was dx prior to gallbladder surgery but no trouble since the gb removed"   ??? Bipolar 1 disorder (HCC)    ??? Anesthesia      "I wake up freaking out after  surgery"   ??? Polycystic ovarian syndrome    ??? Claustrophobia 09/16/2013     MRI BRAIN   ??? Pseudotumor cerebri      Past Surgical History   Procedure Laterality Date   ??? Anterior cruciate ligament repair       right knee 2010(pc)   ??? Cholecystectomy  age 48-2004   ??? Hysterectomy  age 22     "removed everything but the ovaries and had to repair my bladder"   ??? Dental surgery       "wisdom teeth removed age 86, put to sleep"   ??? Carpal tunnel release  12/21/10     right hand   ??? Carpal tunnel release  01/18/11     left carpal tunnel release      Family History   Problem Relation Age of Onset   ??? Asthma Mother    ??? Mental Illness Mother    ??? Heart Disease Father      MI   ??? High Blood Pressure Father    ??? Asthma Daughter      History     Social History   ??? Marital Status: Married     Spouse Name: N/A     Number of Children: N/A   ???  Years of Education: N/A     Occupational History   ??? Not on file.     Social History Main Topics   ??? Smoking status: Former Smoker -- 0.50 packs/day for 10 years     Quit date: 12/06/2011   ??? Smokeless tobacco: Never Used   ??? Alcohol Use: Yes      Comment: social, quit over a year ago   ??? Drug Use: No   ??? Sexual Activity:     Partners: Male     Other Topics Concern   ??? Not on file     Social History Narrative     Current Facility-Administered Medications   Medication Dose Route Frequency Provider Last Rate Last Dose   ??? morphine (PF) injection 4 mg  4 mg Intravenous Q30 Min PRN Nadyne Coombes, MD   4 mg at 10/02/13 2022     Current Outpatient Prescriptions   Medication Sig Dispense Refill   ??? ibuprofen (ADVIL;MOTRIN) 800 MG tablet Take 800 mg by mouth every 6 hours as needed for Pain       ??? butalbital-aspirin-caffeine (FIORINAL) 50-325-40 MG capsule Take 1 capsule by mouth every 4 hours as needed for Migraine       ??? acetaZOLAMIDE (DIAMOX) 500 MG capsule Take 1 capsule by mouth 2 times daily  60 capsule  0     Allergies   Allergen Reactions   ??? Inderal [Propranolol Hcl] Anaphylaxis      Heart rate dropped, couldn't walk   ??? Cephalexin Diarrhea   ??? Pcn [Penicillins]    ??? Amoxicillin Rash     "bloody stool"       Nursing Notes Reviewed    Physical Exam:  ED Triage Vitals   Enc Vitals Group      BP 10/02/13 1803 119/69 mmHg      Pulse 10/02/13 1803 96      Resp 10/02/13 1803 16      Temp 10/02/13 1803 98.2 ??F (36.8 ??C)      Temp Source 10/02/13 1803 Oral      SpO2 10/02/13 1803 100 %      Weight 10/02/13 1803 280 lb (127.007 kg)      Height 10/02/13 1803  (1.727 m)      Head Cir --       Peak Flow --       Pain Score --       Pain Loc --       Pain Edu? --       Excl. in GC? --        My pulse ox interpretation is - normal    General appearance:  No acute distress while laying flat or sit her up she complains of bilateral throbbing headache,   Skin:  Warm. Dry.   Eye:  Extraocular movements intact.     Ears, nose, mouth and throat:  Oral mucosa moist   Neck:  Trachea midline.  patient able to put chin to chest and ears to shoulder with no pain in neck, but movement does make her headache worse.    Extremity:  No swelling.  Normal ROM     Heart:  Regular rate and rhythm, normal S1 & S2, no extra heart sounds.    Perfusion:  intact  Respiratory:  Lungs clear to auscultation bilaterally.  Respirations nonlabored.     Abdominal:  Normal bowel sounds.  Soft.  Nontender.  Non distended.  Back:  No CVA tenderness  to palpation     Neurological:  Alert and oriented times 3.  No focal neuro deficits.  Bilateral upper extremity and lower extremity have normal strength, symmetrical reflexes, Kernig's and Brudzinski's is negative,           Psychiatric:  Appropriate    I have reviewed and interpreted all of the currently available lab results from this visit (if applicable):  Results for orders placed during the hospital encounter of 10/02/13   CBC WITH AUTO DIFFERENTIAL       Result Value Ref Range    WBC 14.1 (*) 4.0 - 10.5 K/CU MM    RBC 5.02  4.2 - 5.4 M/CU MM    Hemoglobin 15.3  12.5 - 16.0 GM/DL     Hematocrit 52.8  37 - 47 %    MCV 88.7  78 - 100 FL    MCH 30.4  27 - 31 PG    MCHC 34.2  32.0 - 36.0 %    RDW 14.1  11.7 - 14.9 %    Platelets 307  140 - 440 K/CU MM    MPV 8.1  7.5 - 11.1 FL    Differential Type AUTOMATED DIFFERENTIAL      Segs Relative 75.9 (*) 36 - 66 %    Lymphocytes Relative 18.8 (*) 24 - 44 %    Monocytes Relative 3.4  0 - 4 %    Eosinophils Relative Percent 1.8  0 - 3 %    Basophils Relative 0.1  0 - 1 %    Segs Absolute 10.7      Lymphocytes Absolute 2.6      Monocytes Absolute 0.5      Eosinophils Absolute 0.2      Basophils Absolute 0.0     COMPREHENSIVE METABOLIC PANEL       Result Value Ref Range    Sodium 142  136 - 145 MMOL/L    Potassium 3.6  3.5 - 5.1 MMOL/L    Chloride 108  99 - 110 mMol/L    CO2 20 (*) 21 - 32 MMOL/L    BUN 16  6 - 23 MG/DL    CREATININE 0.7  0.6 - 1.1 MG/DL    Glucose, Fasting 92  70 - 99 MG/DL    Calcium 8.9  8.3 - 41.3 MG/DL    Alb 4.0  3.4 - 5.0 GM/DL    Total Protein 6.5  6.4 - 8.2 GM/DL    Total Bilirubin 0.4  0.0 - 1.0 MG/DL    ALT 24  10 - 40 U/L    AST 15  15 - 37 IU/L    Alkaline Phosphatase 91  40 - 129 IU/L    GFR Non-African American >60      GFR African American >60      Anion Gap 14  4 - 16      Radiographs (if obtained):   The following radiograph was interpreted by myself in the absence of a radiologist:    Radiologist's Report Reviewed:  CT HEAD WO CONTRAST    Final Result:              EKG (if obtained): (All EKG's are interpreted by myself in the absence of a cardiologist)    Chart review shows recent radiographs:  Ct Head Wo Contrast    09/29/2013    EXAMINATION: CT OF THE HEAD WITHOUT CONTRAST 09/29/2013 4:52 pm: TECHNIQUE: CT of the head was performed without the administration of intravenous  contrast. COMPARISON: 08/20/2013 HISTORY: severe headache; recent LP for pseudotumor FINDINGS: BRAIN/VENTRICLES: There is no acute intracranial hemorrhage, mass effect or midline shift. No abnormal extra-axial fluid collection. The gray-white  differentiation is maintained without evidence of an acute infarct. There is no evidence of hydrocephalus. ORBITS: The visualized portion of the orbits demonstrate no acute abnormality. SINUSES: The visualized paranasal sinuses and mastoid air cells demonstrate no acute abnormality. SOFT TISSUES/SKULL: No acute abnormality of the visualized skull or soft tissues. IMPRESSION: No acute intracranial abnormality.  DICTATED BY:   DARWIN, ROBERT H    Xr Chest Portable    09/29/2013    EXAMINATION: SINGLE VIEW OF THE CHEST 09/29/2013 1:13 pm COMPARISON: None. HISTORY: chest pain FINDINGS: The lungs are clear. The heart and mediastinal shadows are normal. The visualized bony thorax is intact. IMPRESSION: No acute cardiopulmonary abnormality.  DICTATED BY:   Phebe Colla, MARTHA     Ir Fluoro Guided Lumbar Puncture    09/27/2013    EXAMINATION: FLUOROSCOPIC GUIDED LUMBAR PUNCTURE 09/27/2013 2:57 pm: HISTORY: Headache, concern for pseudotumor FLUORO TIME: Approximately 0.5 minutes PROCEDURE: OPERATOR: Sriram Mannava Informed consent was obtained after the risks and benefits of the procedure were discussed with the patient and all questions were answered fully. Universal protocol was observed and a standard timeout was performed. The patient was positioned prone and the back was prepped and draped in the normal sterile fashion. 1% lidocaine was used for local anesthesia. The subarachnoid space was accessed with a 20-gauge 3.5" spinal needle at the L3-4 level. Free flow of clear CSF was noted. Opening pressure was approximately 45 cm. Approximately 25 ml of clear CSF was removed and sent for analysis. Closing pressure was 13 cm. The stylet was reinserted, spinal needle was removed and brief pressure was applied at the puncture site. There were no immediate complications and the patient tolerated the procedure well. IMPRESSION: Successful fluoroscopic-guided lumbar puncture. Opening pressure of 45 cm, closing pressure of 13 cm.  Approximately 25 mL of clear CSF was removed.  DICTATED BY:   MANNAVA, SRIRAM     Ir Spinal Puncture Lumbar Diag    09/29/2013    EXAMINATION: FLUOROSCOPIC GUIDED LUMBAR PUNCTURE 09/29/2013 3:36 pm: HISTORY: Headache. FLUORO TIME: 4.4 minutes. PROCEDURE: OPERATOR: Raynelle Bring Informed consent was obtained after the risks and benefits of the procedure were discussed with the patient and all questions were answered fully. Universal protocol was observed and a standard timeout was performed. The patient was positioned prone and the back was prepped and draped in the normal sterile fashion. 1% lidocaine was used for local anesthesia. The subarachnoid space was accessed with a 20-gauge spinal needle at the L2/L3 level. Free flow of clear CSF was noted. Approximately 8 ml of CSF was removed and sent for analysis. The stylet was reinserted, spinal needle was removed and brief pressure was applied at the puncture site. There were no immediate complications and the patient tolerated the procedure well. An opening pressure of 12 ml of water was obtained. A closing pressure of 9 ml of water was obtained. Study was somewhat difficult due to patient body habitus. IMPRESSION: Successful fluoroscopic-guided lumbar puncture.  DICTATED BY:   DARWIN, ROBERT H    Ir Spinal Puncture Lumbar Diag    09/27/2013    EXAMINATION: FLUOROSCOPIC GUIDED LUMBAR PUNCTURE 09/27/2013 2:57 pm: HISTORY: Headache, concern for pseudotumor FLUORO TIME: Approximately 0.5 minutes PROCEDURE: OPERATOR: Sriram Mannava Informed consent was obtained after the risks and benefits of the procedure were discussed with the patient  and all questions were answered fully. Universal protocol was observed and a standard timeout was performed. The patient was positioned prone and the back was prepped and draped in the normal sterile fashion. 1% lidocaine was used for local anesthesia. The subarachnoid space was accessed with a 20-gauge 3.5" spinal needle at the L3-4 level.  Free flow of clear CSF was noted. Opening pressure was approximately 45 cm. Approximately 25 ml of clear CSF was removed and sent for analysis. Closing pressure was 13 cm. The stylet was reinserted, spinal needle was removed and brief pressure was applied at the puncture site. There were no immediate complications and the patient tolerated the procedure well. IMPRESSION: Successful fluoroscopic-guided lumbar puncture. Opening pressure of 45 cm, closing pressure of 13 cm. Approximately 25 mL of clear CSF was removed.  DICTATED BY:   MANNAVA, SRIRAM     Mri Brain Wo Contrast    09/16/2013    EXAMINATION: MRI OF THE BRAIN WITHOUT CONTRAST 09/16/2013 10:56 am TECHNIQUE: Multiplanar, multisequence MRI of the brain was performed without the administration of intravenous contrast. COMPARISON: None. HISTORY: 307.81 TENSION HEADACHE. POSSIBLE CAFFEINE WITHDRAWL FINDINGS: INTRACRANIAL STRUCTURES/VENTRICLES: The signal properties of the brain parenchyma are normal. 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 No intracranial mass, acute or subacute hemorrhage, or extra-axial fluid collection is identified. Normal flow related signal void is present in the major intracranial arteries. The ventricular and cisternal spaces are normal for age. ORBITS: The intraorbital contents are normal. SINUSES: The visualized paranasal sinuses and mastoid air cells are clear. BONES/SOFT TISSUES: The bone marrow signal properties are normal. IMPRESSION: 1. 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 2. Otherwise unremarkable brain MRI.  DICTATED BY:   Thornton Papas       MDM:    28 year old female with history of migraine headaches, lumbar puncture ??3, and pseudotumor cerebri all which could be playing into her headache history.  As this headache is very positional, her last opening pressure  was normal, and the nature of her headache is different than her elevated CSF pressure is I doubt that this is her pseudotumor cerebri.  I will do a CT scan to make sure that she has had no changes.  Without fevers or chills or nuchal rigidity I do not believe this to be meningitis.  I will treat her for a migraine/LP headache with fluids and medications, do a head CT, do screening labs, and reevaluate.      Patient is completely pain-free, CT scan was negative for any bleed, patient remains afebrile.  She is sitting up in bed eating and drinking.  She continues to have no nuchal rigidity with her chin to the chest and ear to the shoulders without difficulty.      Clinical Impression:  1. Spinal puncture headache      Disposition referral (if applicable):  Rande Brunt, MD  [address]  [place] [address]  [phone]    Call in 1 day      Disposition medications (if applicable):  New Prescriptions    No medications on file       Comment: Please note this report has been produced using speech recognition software and may contain errors related to that system including errors in grammar, punctuation, and spelling, as well as words and phrases that may be inappropriate. If there are any questions  or concerns please feel free to contact the dictating provider for clarification.    Nadyne Coombes, MD  10/02/13 2249

## 2013-10-02 NOTE — ED Notes (Signed)
Bed: ED27  Expected date:   Expected time:   Means of arrival:   Comments:  Triage- SR

## 2013-10-02 NOTE — ED Notes (Signed)
Bed: 04TR  Expected date:   Expected time:   Means of arrival:   Comments:  Patient still in room

## 2013-10-02 NOTE — ED Notes (Signed)
Patient brought to the ED via EMS with complaints of a "spinal headache". The patient has had 3 lumbar punctures since 09/25/13, the last of which was 3 days ago. Patient states that orgasms make it worse. States that laying flat is the only way that she gets any relief. States that she has been to [facility] twice but has been sent home.     Mareena Cavan L. Dorothyann Gibbs, RN  10/02/13 1810

## 2013-10-03 LAB — COMPREHENSIVE METABOLIC PANEL
ALT: 24 U/L (ref 10–40)
AST: 15 IU/L (ref 15–37)
Albumin: 4 GM/DL (ref 3.4–5.0)
Alkaline Phosphatase: 91 IU/L (ref 40–129)
Anion Gap: 14 (ref 4–16)
BUN: 16 MG/DL (ref 6–23)
CO2: 20 MMOL/L — ABNORMAL LOW (ref 21–32)
Calcium: 8.9 MG/DL (ref 8.3–10.6)
Chloride: 108 mMol/L (ref 99–110)
Creatinine: 0.7 MG/DL (ref 0.6–1.1)
GFR African American: >60 mL/min/{1.73_m2}
GFR Non-African American: >60 mL/min/{1.73_m2}
Glucose, Fasting: 92 MG/DL (ref 70–99)
Potassium: 3.6 MMOL/L (ref 3.5–5.1)
Sodium: 142 MMOL/L (ref 136–145)
Total Bilirubin: 0.4 MG/DL (ref 0.0–1.0)
Total Protein: 6.5 GM/DL (ref 6.4–8.2)

## 2013-10-03 LAB — CBC WITH AUTO DIFFERENTIAL
Basophils %: 0.1 % (ref 0–1)
Basophils Absolute: 0 10*3/uL
Eosinophils %: 1.8 % (ref 0–3)
Eosinophils Absolute: 0.2 10*3/uL
Hematocrit: 44.5 % (ref 37–47)
Hemoglobin: 15.3 GM/DL (ref 12.5–16.0)
Lymphocytes %: 18.8 % — ABNORMAL LOW (ref 24–44)
Lymphocytes Absolute: 2.6 10*3/uL
MCH: 30.4 PG (ref 27–31)
MCHC: 34.2 % (ref 32.0–36.0)
MCV: 88.7 FL (ref 78–100)
MPV: 8.1 FL (ref 7.5–11.1)
Monocytes %: 3.4 % (ref 0–4)
Monocytes Absolute: 0.5 10*3/uL
Platelets: 307 10*3/uL (ref 140–440)
RBC: 5.02 10*6/uL (ref 4.2–5.4)
RDW: 14.1 % (ref 11.7–14.9)
Segs Absolute: 10.7 10*3/uL
Segs Relative: 75.9 % — ABNORMAL HIGH (ref 36–66)
WBC: 14.1 10*3/uL — ABNORMAL HIGH (ref 4.0–10.5)

## 2013-10-03 LAB — CULTURE, CSF: Gram Smear: NONE SEEN

## 2013-10-03 MED ORDER — MORPHINE SULFATE (PF) 4 MG/ML IV SOLN
4 MG/ML | INTRAVENOUS | Status: DC | PRN
Start: 2013-10-03 — End: 2013-10-03
  Administered 2013-10-03: 4 mg via INTRAVENOUS

## 2013-10-03 MED ORDER — SODIUM CHLORIDE 0.9 % IV BOLUS
0.9 % | Freq: Once | INTRAVENOUS | Status: AC
Start: 2013-10-03 — End: 2013-10-02
  Administered 2013-10-03: 1000 mL via INTRAVENOUS

## 2013-10-03 MED FILL — MORPHINE SULFATE (PF) 4 MG/ML IV SOLN: 4 mg/mL | INTRAVENOUS | Qty: 1

## 2013-10-03 MED FILL — SODIUM CHLORIDE 0.9 % IV SOLN: 0.9 % | INTRAVENOUS | Qty: 1000

## 2013-12-19 LAB — ALT: ALT: 14 U/L (ref 10–40)

## 2013-12-23 LAB — HEPATITIS C ANTIBODY
Hep C Ab Interp: NEGATIVE
Hepatitis C Ab: 0.14

## 2014-02-15 ENCOUNTER — Emergency Department: Admit: 2014-02-15

## 2014-02-15 ENCOUNTER — Encounter: Admit: 2014-02-15

## 2014-02-15 ENCOUNTER — Inpatient Hospital Stay: Admit: 2014-02-15 | Discharge: 2014-02-15

## 2014-02-15 DIAGNOSIS — R519 Headache, unspecified: Secondary | ICD-10-CM

## 2014-02-15 LAB — GLUCOSE, CSF: Glucose, CSF: 55 MG/DL (ref 40–75)

## 2014-02-15 LAB — PROTEIN, CSF: Protein, CSF: 16 MG/DL (ref 15–45)

## 2014-02-15 MED ORDER — ONDANSETRON 4 MG PO TBDP
4 MG | ORAL_TABLET | Freq: Three times a day (TID) | ORAL | Status: DC | PRN
Start: 2014-02-15 — End: 2014-08-03

## 2014-02-15 MED ORDER — ONDANSETRON 4 MG PO TBDP
4 MG | Freq: Once | ORAL | Status: AC
Start: 2014-02-15 — End: 2014-02-15
  Administered 2014-02-15: 21:00:00 4 mg via ORAL

## 2014-02-15 MED ORDER — OXYCODONE-ACETAMINOPHEN 5-325 MG PO TABS
5-325 MG | Freq: Once | ORAL | Status: AC
Start: 2014-02-15 — End: 2014-02-15
  Administered 2014-02-15: 21:00:00 1 via ORAL

## 2014-02-15 MED FILL — ONDANSETRON 4 MG PO TBDP: 4 MG | ORAL | Qty: 1

## 2014-02-15 MED FILL — OXYCODONE-ACETAMINOPHEN 5-325 MG PO TABS: 5-325 MG | ORAL | Qty: 1

## 2014-02-15 NOTE — ED Notes (Signed)
Patient sat up without difficulty.  States her HA remains  But is much better.  Face flushed.      Jessica Columbiahristine A Elver Stadler, RN  02/15/14 [phone]

## 2014-02-15 NOTE — ED Provider Notes (Signed)
eMERGENCY dEPARTMENT eNCOUnter      PCP: No primary provider on file.    CHIEF COMPLAINT    Chief Complaint   Patient presents with   ??? Headache       HPI    Jessica Krause is a 29 y.o. female who presents with gradual onset of a headache since onset  over the past week.   Pain is localized in the behind eyes and in the frontal portion of the head.  The duration has been since onset.  The quality of the headache is pressure.  This headache is not the worst headache of the patient's life.  The patient has associated left finger tingling, and bilateral toe tingling.   Patient has a history of headaches due to pseudotumor cerebri and denies any new symptoms different from previous headaches.  She states she currently is on Topamax, follows with neurologist Dr. Inda MerlinHickman.  Patient has had spinal taps in the past to relieve pressures.      REVIEW OF SYSTEMS   Constitutional:  Denies fever, chills, weight loss or weakness   Neurologic:  See HPI.  Denies confusion or memory loss.  Denies light-headedness, dizziness, or LOC.  Denies stiff neck.  Denies weakness or sensory changes   Eyes:   Denies discharge, dipplopia, blurred vision, or loss visual field  HENT:  Denies sore throat or ear pain   Cardiovascular:  Denies chest pain, palpitations.  Respiratory:  Denies cough, shortness of breath, respiratory discomfort   GI  Denies abdominal pain.  No nausea, vomiting, or diarrhea.    GU:  Denies Dysuria or Hematuria.  Adamantly denies pregnancy  Musculoskeletal:  Positive chronic back pain from spinal taps    Skin:  Denies rash   Endocrine:  Denies polyuria or polydypsia   Lymphatic:  Denies swollen glands   Psychiatric:  Denies depression, suicidal ideation or homicidal ideation     All other review of systems negative at this time  See HPI and nursing notes for additional information      PAST MEDICAL & SURGICAL HISTORY    Past Medical History   Diagnosis Date   ??? Asthma    ??? Migraine    ??? Arthritis    ??? IBS (irritable  bowel syndrome)      "was dx prior to gallbladder surgery but no trouble since the gb removed"   ??? Bipolar 1 disorder (HCC)    ??? Anesthesia      "I wake up freaking out after surgery"   ??? Polycystic ovarian syndrome    ??? Claustrophobia 09/16/2013     MRI BRAIN   ??? Pseudotumor cerebri      Past Surgical History   Procedure Laterality Date   ??? Anterior cruciate ligament repair       right knee 2010(pc)   ??? Cholecystectomy  age 76-2004   ??? Hysterectomy  age 89     "removed everything but the ovaries and had to repair my bladder"   ??? Dental surgery       "wisdom teeth removed age 29, put to sleep"   ??? Carpal tunnel release  12/21/10     right hand   ??? Carpal tunnel release  01/18/11     left carpal tunnel release        CURRENT MEDICATIONS    Current Outpatient Rx   Name  Route  Sig  Dispense  Refill   ??? ibuprofen (ADVIL;MOTRIN) 800 MG tablet    Oral  Take 800 mg by mouth every 6 hours as needed for Pain             ??? butalbital-aspirin-caffeine (FIORINAL) 50-325-40 MG capsule    Oral    Take 1 capsule by mouth every 4 hours as needed for Migraine             ??? acetaZOLAMIDE (DIAMOX) 500 MG capsule    Oral    Take 1 capsule by mouth 2 times daily    60 capsule    0         ALLERGIES    Allergies   Allergen Reactions   ??? Inderal [Propranolol Hcl] Anaphylaxis     Heart rate dropped, couldn't walk   ??? Cephalexin Diarrhea   ??? Pcn [Penicillins]    ??? Amoxicillin Rash     "bloody stool"       SOCIAL & FAMILY HISTORY    History     Social History   ??? Marital Status: Married     Spouse Name: N/A     Number of Children: N/A   ??? Years of Education: N/A     Social History Main Topics   ??? Smoking status: Former Smoker -- 0.50 packs/day for 10 years     Quit date: 12/06/2011   ??? Smokeless tobacco: Never Used   ??? Alcohol Use: Yes      Comment: social, quit over a year ago   ??? Drug Use: No   ??? Sexual Activity:     Partners: Male     Other Topics Concern   ??? None     Social History Narrative     Family History   Problem Relation Age  of Onset   ??? Asthma Mother    ??? Mental Illness Mother    ??? Heart Disease Father      MI   ??? High Blood Pressure Father    ??? Asthma Daughter        PHYSICAL EXAM    VITAL SIGNS: BP 139/88 mmHg   Pulse 108   Temp(Src) 98 ??F (36.7 ??C) (Oral)   Resp 18   Ht  (1.727 m)   Wt 285 lb (129.275 kg)   BMI 43.34 kg/m2   SpO2 99%   Constitutional:  Well developed, well nourished, no acute distress     HENT:  Atraumatic.    Eyes: Conjunctiva clear.  No tearing . Pupils equally round and react to light, extraocular movement are intact.  No obvious abnormalities noted on funduscopic exam.  Neck: supple, no JVD.    Cardiovascular:  Tachycardic and normal rhythm, no murmurs/rubs/gallops.  Respiratory:  Lungs Clear, no retractions   GI:  Soft, nontender, normal bowel sounds  Musculoskeletal:  No edema, no deformities  Integument:  Well hydrated, no petechiae       Neurologic:    - Alert & oriented person, place, time, and situation, no speech difficulties or slurring.  - No obvious gross motor deficits  - Cranial nerves 2-12 grossly intact  - Sensation intact to light touch  - Strength 5/5 in upper and lower extremities bilaterally  - Negative Brudzinski's and Kernig's signs.  - Normal finger to nose test bilaterally  - Rapid alternating movements intact  - Normal heel-shin bilaterally  - No pronator drift.  - Romberg negative   - Light touch sensation intact throughout.  - Upper and lower extremity DTRs 2+ bilaterally.  - Gait steady and without difficulty  Psych: Pleasant affect, no hallucinations      LABS:   Results for orders placed during the hospital encounter of 02/15/14   CSF CULTURE       Result Value Ref Range    Specimen CSF      Request Problem NONE      Gram Smear NO ORGANISMS SEEN      Gram Smear NO WHITE BLOOD CELLS      Gram Smear RARE RED BLOOD CELLS      Gram Smear CONCENTRATED CYTOSPIN PREP      Culture Pending      Report Status Pending     CSF CELL COUNT WITH DIFFERENTIAL       Result Value Ref Range     RBC, CSF 1 (*) 0 CU MM    WBC, CSF 1  0 - 5 CU MM    Other Cells, CSF OTHER CELL TYPE NOT SEEN      Segs 0      Lymphs, CSF 100      Other Cells, CSF 0     PROTEIN, CSF       Result Value Ref Range    Protein, CSF 16  15 - 45 MG/DL   GLUCOSE, CSF       Result Value Ref Range    Glucose, CSF 55  40 - 75 MG/DL           IMAGING:   CT head:  IMPRESSION:  No acute intracranial abnormality.       Narrative:     EXAMINATION:  CT OF THE HEAD WITHOUT CONTRAST 02/15/2014 3:20 pm:    TECHNIQUE:  CT of the head was performed without the administration of intravenous  contrast.    COMPARISON:  October 02, 2013    HISTORY:  HEADACHE    Initial evaluation for acute headache. History of pseudotumor cerebri.    FINDINGS:  BRAIN/VENTRICLES: There is no acute intracranial hemorrhage, mass effect or  midline shift. No abnormal extra-axial fluid collection. The gray-white  differentiation is maintained without evidence of an acute infarct. There is  no evidence of hydrocephalus.    ORBITS: The visualized portion of the orbits demonstrate no acute abnormality.    SINUSES: Stable retention cyst or polyp is present within the right maxillary  sinus.    SOFT TISSUES/SKULL: No acute abnormality of the visualized skull or soft  tissues.          Differential Diagnosis: Subarachnoid hemorrhage, Meningitis, Temporal arteritis, Pseudotumor Cerebri, Acute renal failure, Migraine,      ED COURSE & MEDICAL DECISION MAKING       Vital signs and nursing notes reviewed during ED course.  Patient care and presentation staffed with supervising MD.  Patient seen by supervising MD today- see his/her note for details of the encounter.    All pertinent Lab data and radiographic results reviewed with patient at bedside.  The patient and/or the family were informed of the results of any tests/labs/imaging, the treatment plan, and time was allotted to answer questions.     Patient presents with frontal headache for the past week.  Patient notes associated  pressure behind his eyes.  Patient has history of pseudotumor cerebri currently follows with neurologist Dr. Inda Merlin.  Patient has associated left finger tingling, and bilateral toe tingling.  Patient has had spinal taps in the past due to increased pressures,  Most recently in August.  On exam patient is neurologically intact.  No obvious abnormalities noted  on funduscopic exam.  CT shows no acute intracranial abnormalities.  Patient sent for fluoroscopy guided lumbar puncture.  Patient returns from lumbar puncture without problem.  Patient states she is feeling much better with improvement of headache and symptoms.  Patient monitored and had lain flat for one hour.  Patient reevaluated continues to have improvement in headache.  Discussed with patient the importance of following up with neurology as soon as possible.     Clinical  IMPRESSION    1. Nonintractable headache, unspecified chronicity pattern, unspecified headache type    2. Pseudotumor cerebri        Discussed imaging results with patient.  Patient provided a prescription of Zofran for any continued nausea.     I recommend follow-up with neurologist as soon as possible.    Diagnosis and plan discussed in detail with patient who understands and agrees.  Return to emergency Department precautions, which included any change in nature or severity of headaches or any new symptoms, were discussed in detail with patient who understands and agrees.      Comment: Please note this report has been produced using speech recognition software and may contain errors related to that system including errors in grammar, punctuation, and spelling, as well as words and phrases that may be inappropriate. If there are any questions or concerns please feel free to contact the dictating provider for clarification.            Marcell Barlow [facility], PA-C  02/15/14 2102

## 2014-02-15 NOTE — ED Notes (Signed)
Pt to radiology via cart for Lumbar Puncture exam  Performed by Dr Runell GessPasserini  Removed 7.655ml clear CSF fluid with opening prx 24 ending prx 18. Pt instructed to lay flat post procedure for 1 hr. Advised Nurse Thayer Ohmhris of pts return and confirmed CSF lab orders with PA Clifton CustardAaron. GJ

## 2014-02-15 NOTE — ED Notes (Signed)
Patient s right thumb twitching has improved but still remains.    Jessica Columbiahristine A Vici Novick, RN  02/15/14 [phone]

## 2014-02-15 NOTE — ED Notes (Signed)
Patient back from LP.  States pain is much better.  Patient is aware that she must stay flat until 6pm.    Ernestina Columbiahristine A Katrenia Alkins, RN  02/15/14 [phone]

## 2014-02-15 NOTE — ED Notes (Signed)
Patient has idiopathic increased pressure in her brain and states she gets frequent LP's to evaluate these pressures.  She feels as if her pressures are up now and has a HA from this.    Ernestina Columbiahristine A Rhina Kramme, RN  02/15/14 [phone]

## 2014-02-15 NOTE — ED Provider Notes (Signed)
As physician-in-triage, I performed a medical screening history and physical exam on this patient.    HISTORY OF PRESENT ILLNESS  Jessica Krause is a 29 y.o. female with a history of pseudotumor cerebri with gradually worsening headache over the last few days along with some visual disturbance and numbness in her hands.  She [facility] a specialist [facility] but has never had a shunt placed at this point.     PHYSICAL EXAM  Pulse 108   Temp(Src) 98 ??F (36.7 ??C) (Oral)   Resp 18   Ht 5\' 8"  (1.727 m)   Wt 285 lb (129.275 kg)   BMI 43.34 kg/m2   SpO2 99%    On exam, the patient appears well-hydrated, well-nourished, and in no acute distress. Mucous membranes are moist. Speech is clear. Breathing is unlabored.  Skin is dry. Mental status is normal. The patient has normal gait, moves all extremities, and is without facial droop.     Comment: Please note this report has been produced using speech recognition software and may contain errors related to that system including errors in grammar, punctuation, and spelling, as well as words and phrases that may be inappropriate. If there are any questions or concerns please feel free to contact the dictating provider for clarification.    Mertha Findersohn Tysen Trent Theisen, MD  02/15/14 1454

## 2014-02-16 LAB — CELL COUNT WITH DIFFERENTIAL, CSF
Lymphs, CSF: 100 %
Other Cells, CSF: 0 %
Other Cells, CSF: NONE SEEN CU MM
RBC, CSF: 1 CU MM — ABNORMAL HIGH
Segs: 0 %
WBC, CSF: 1 CU MM (ref 0–5)

## 2014-02-19 LAB — CULTURE, CSF: Gram Smear: NONE SEEN

## 2014-02-23 ENCOUNTER — Encounter: Attending: Family

## 2014-08-03 DIAGNOSIS — J029 Acute pharyngitis, unspecified: Secondary | ICD-10-CM

## 2014-08-03 NOTE — ED Triage Notes (Signed)
Pt states she has been having joint discomfort that has been increasing in the last three months. Pt also complains of a sore throat and thinks she has enlarged lymph nodes.

## 2014-08-03 NOTE — ED Provider Notes (Signed)
Triage Chief Complaint:   Joint Pain; Pharyngitis; and Neck Pain    HOPI:  Jessica Krause is a 29 y.o. female that presents with generalized joint aches, pharyngitis and lymphadenopathy. She tells me she's had generalized body aches for the past several months.  The past 24 hours she's had a sore throat.  For the past several days she's felt like a lymph node in her right elbow has been enlarged.  She denies fever or chills.  No cough or congestion.  No productive sputum or hemoptysis.  No chest pain or palpitations.  No abdominal pain or vomiting.  No flank pain or urinary symptoms.  She denies rash.  She denies swelling in the joints.  She seen her PCP but has not undergone a rheumatic workup.  She did have a sed rate done at one time which was slightly elevated.  No family history of connective tissue disease.  She did have a problem with headaches and was found to have pseudotumor cerebri.  She had multiple lumbar punctures over several months.  Now is double or blurry vision.  She's had no eye change from her PTC. No other associated signs or symptoms.  She is concerned about a great many things.  She does have anxiety for these conditions.  She is afraid she has "lymphoma" or possibly "Lupus".    ROS:  At least 10 systems reviewed and otherwise acutely negative except as in the [place].    Past Medical History   Diagnosis Date   ??? Anesthesia      "I wake up freaking out after surgery"   ??? Arthritis    ??? Asthma    ??? Bipolar 1 disorder ([place])    ??? Claustrophobia 09/16/2013     MRI BRAIN   ??? IBS (irritable bowel syndrome)      "was dx prior to gallbladder surgery but no trouble since the gb removed"   ??? Migraine    ??? Polycystic ovarian syndrome    ??? Pseudotumor cerebri      Past Surgical History   Procedure Laterality Date   ??? Anterior cruciate ligament repair       right knee 2010(pc)   ??? Cholecystectomy  age 51-2004   ??? Hysterectomy  age 39     "removed everything but the ovaries and had to repair my bladder"   ???  Dental surgery       "wisdom teeth removed age 49, put to sleep"   ??? Carpal tunnel release  12/21/10     right hand   ??? Carpal tunnel release  01/18/11     left carpal tunnel release      Family History   Problem Relation Age of Onset   ??? Asthma Mother    ??? Mental Illness Mother    ??? Heart Disease Father      MI   ??? High Blood Pressure Father    ??? Asthma Daughter      Social History     Social History   ??? Marital status: Married     Spouse name: N/A   ??? Number of children: N/A   ??? Years of education: N/A     Occupational History   ??? Not on file.     Social History Main Topics   ??? Smoking status: Former Smoker     Packs/day: 0.50     Years: 10.00     Quit date: 12/06/2011   ??? Smokeless tobacco: Never Used   ???  Alcohol use Yes      Comment: social, quit over a year ago   ??? Drug use: No   ??? Sexual activity: Yes     Partners: Male     Other Topics Concern   ??? Not on file     Social History Narrative     No current facility-administered medications for this encounter.      Current Outpatient Prescriptions   Medication Sig Dispense Refill   ??? predniSONE (DELTASONE) 10 MG tablet Take 1 tablet by mouth 2 times daily for 6 days 12 tablet 0   ??? ibuprofen (ADVIL;MOTRIN) 800 MG tablet Take 800 mg by mouth every 6 hours as needed for Pain       Allergies   Allergen Reactions   ??? Inderal [Propranolol Hcl] Anaphylaxis     Heart rate dropped, couldn't walk   ??? Cephalexin Diarrhea   ??? Pcn [Penicillins]    ??? Amoxicillin Rash     "bloody stool"       Nursing Notes Reviewed    Physical Exam:  ED Triage Vitals   Enc Vitals Group      BP 08/03/14 2108 143/77      Pulse 08/03/14 2108 91      Resp 08/03/14 2108 17      Temp 08/03/14 2108 98 ??F (36.7 ??C)      Temp Source 08/03/14 2108 Oral      SpO2 08/03/14 2108 100 %      Weight 08/03/14 2108 285 lb (129.3 kg)      Height 08/03/14 2108 '5\' 8"'  (1.727 m)      Head Cir --       Peak Flow --       Pain Score --       Pain Loc --       Pain Edu? --       Excl. in [place]? --      GENERAL APPEARANCE:  Awake and alert. Cooperative. No acute distress. BMI is 43.4.  HEAD: Normocephalic. Atraumatic.  EYES: EOM's grossly intact. Sclera anicteric.  ENT: Mucous membranes are moist. Tolerates saliva. No trismus.  NECK: Supple. No meningismus. Trachea midline.  HEART: RRR. Radial pulses 2+.  LUNGS: Respirations unlabored. CTAB  ABDOMEN: Soft. Non-tender. No guarding or rebound.  EXTREMITIES: No acute deformities.  SKIN: Warm and dry.  NEUROLOGICAL: No gross facial drooping. Moves all 4 extremities spontaneously.  PSYCHIATRIC: Normal mood.    I have reviewed and interpreted all of the currently available lab results from this visit (if applicable):  Results for orders placed or performed during the hospital encounter of 08/03/14   Strep Screen Group A  - Throat   Result Value Ref Range    Specimen THROAT     Request Problem NONE     Strep A Direct Screen NEGATIVE     Culture Pending     Report Status Pending    CBC Auto Differential   Result Value Ref Range    WBC 12.1 (H) 4.0 - 10.5 K/CU MM    RBC 4.73 4.2 - 5.4 M/CU MM    Hemoglobin 13.9 12.5 - 16.0 GM/DL    Hematocrit 41.2 37 - 47 %    MCV 87.2 78 - 100 FL    MCH 29.4 27 - 31 PG    MCHC 33.7 32.0 - 36.0 %    RDW 13.7 11.7 - 14.9 %    Platelets 286 140 - 440 K/CU MM  MPV 7.8 7.5 - 11.1 FL    Differential Type AUTOMATED DIFFERENTIAL     Segs Relative 69.1 (H) 36 - 66 %    Lymphocytes Relative 23.1 (L) 24 - 44 %    Monocytes Relative 5.5 (H) 0 - 4 %    Eosinophils Relative Percent 2.0 0 - 3 %    Basophils Relative 0.3 0 - 1 %    Segs Absolute 8.4 K/CU MM    Lymphocytes Absolute 2.8 K/CU MM    Monocytes Absolute 0.7 K/CU MM    Eosinophils Absolute 0.2 K/CU MM    Basophils Absolute 0.0 K/CU MM   Comprehensive Metabolic Panel   Result Value Ref Range    Sodium 142 135 - 145 MMOL/L    Potassium 3.9 3.5 - 5.1 MMOL/L    Chloride 105 99 - 110 mMol/L    CO2 24 21 - 32 MMOL/L    BUN 16 6 - 23 MG/DL    CREATININE 0.6 0.6 - 1.1 MG/DL    Glucose, Fasting 88 70 - 99 MG/DL    Calcium  9.1 8.3 - 10.6 MG/DL    Alb 4.1 3.4 - 5.0 GM/DL    Total Protein 7.1 6.4 - 8.2 GM/DL    Total Bilirubin 0.2 0.0 - 1.0 MG/DL    ALT 14 10 - 40 U/L    AST 13 (L) 15 - 37 IU/L    Alkaline Phosphatase 74 40 - 129 IU/L    GFR Non-African American >60 mL/min/1.3m    GFR African American >60 mL/min/1.769m   Anion Gap 13 4 - 16   Sedimentation Rate   Result Value Ref Range    Sed Rate 25 (H) 0 - 20 MM/HR   Urinalysis with Microscopic   Result Value Ref Range    Color, UA LT YELLOW (A) UYELL    Clarity, UA HAZY (A) CLEAR    Glucose, Urine NEGATIVE NEG MG/DL    Bilirubin Urine NEGATIVE NEG MG/DL    Ketones, Urine NEGATIVE NEG MG/DL    Specific Gravity, UA 1.021 1.001 - 1.035    Blood, Urine NEGATIVE NEG    pH, Urine 5.0 5.0 - 8.0    Protein, UA NEGATIVE NEG MG/DL    Urobilinogen, Urine NORMAL 0.2 - 1.0 EU/DL    Nitrite Urine, Quantitative NEGATIVE NEG    Leukocyte Esterase, Urine NEGATIVE NEG    RBC, UA 1 0 - 6 /HPF    WBC, UA 1 0 - 5 /HPF    Bacteria, UA RARE (A) NEG /HPF    Squam Epithel, UA 2 /HPF    Renal Epithelial, Urine <1 /HPF    Mucus, UA RARE       Radiographs (if obtained):  '[]'  The following radiograph was interpreted by myself in the absence of a radiologist:  '[]'  Radiologist's Report Reviewed:      EKG (if obtained): (All EKG's are interpreted by myself in the absence of a cardiologist)    MDM:  She is placed in a room and examined.  CBC and chemistry panel are normal.  Sed rate is 25.  Strep screen is negative.  Urinalysis is clear.  I have reassured her that I do not see an acute infection.  I do not feel this is something required treatment with an antibody.  This may be a viral process.  Many of the symptoms and complaints she has will require follow-up by her PCP or by specialist.  I encouraged her to follow  that route.  He is asked for prescription for prednisone.  I have some reservation even the prednisone may only mask the symptoms.  He is insisting on prednisone so I've given her prescription for 20  mg for 6 days.  I'll up will be with her CP or PCP of her choice.  I'm suggesting Dr. Deatra Canter for continued workup.  She is in stable condition on release.    Clinical Impression:  1. Acute pharyngitis, unspecified etiology    2. Body aches    3. BMI 40.0-44.9, adult [facility])      (Please note that portions of this note may have been completed with a voice recognition program. Efforts were made to edit the dictations but occasionally words are mis-transcribed.)    Renato Gails, MD          Otis Brace, MD  08/04/14 [phone]

## 2014-08-04 ENCOUNTER — Inpatient Hospital Stay: Admit: 2014-08-04 | Discharge: 2014-08-04 | Attending: Emergency Medicine

## 2014-08-04 LAB — CBC WITH AUTO DIFFERENTIAL
Basophils %: 0.3 % (ref 0–1)
Basophils Absolute: 0 10*3/uL
Eosinophils %: 2 % (ref 0–3)
Eosinophils Absolute: 0.2 10*3/uL
Hematocrit: 41.2 % (ref 37–47)
Hemoglobin: 13.9 GM/DL (ref 12.5–16.0)
Lymphocytes %: 23.1 % — ABNORMAL LOW (ref 24–44)
Lymphocytes Absolute: 2.8 10*3/uL
MCH: 29.4 PG (ref 27–31)
MCHC: 33.7 % (ref 32.0–36.0)
MCV: 87.2 FL (ref 78–100)
MPV: 7.8 FL (ref 7.5–11.1)
Monocytes %: 5.5 % — ABNORMAL HIGH (ref 0–4)
Monocytes Absolute: 0.7 10*3/uL
Platelets: 286 10*3/uL (ref 140–440)
RBC: 4.73 10*6/uL (ref 4.2–5.4)
RDW: 13.7 % (ref 11.7–14.9)
Segs Absolute: 8.4 10*3/uL
Segs Relative: 69.1 % — ABNORMAL HIGH (ref 36–66)
WBC: 12.1 10*3/uL — ABNORMAL HIGH (ref 4.0–10.5)

## 2014-08-04 LAB — COMPREHENSIVE METABOLIC PANEL
ALT: 14 U/L (ref 10–40)
AST: 13 IU/L — ABNORMAL LOW (ref 15–37)
Albumin: 4.1 GM/DL (ref 3.4–5.0)
Alkaline Phosphatase: 74 IU/L (ref 40–129)
Anion Gap: 13 (ref 4–16)
BUN: 16 MG/DL (ref 6–23)
CO2: 24 MMOL/L (ref 21–32)
Calcium: 9.1 MG/DL (ref 8.3–10.6)
Chloride: 105 mMol/L (ref 99–110)
Creatinine: 0.6 MG/DL (ref 0.6–1.1)
GFR African American: >60 mL/min/{1.73_m2}
GFR Non-African American: >60 mL/min/{1.73_m2}
Glucose, Fasting: 88 MG/DL (ref 70–99)
Potassium: 3.9 MMOL/L (ref 3.5–5.1)
Sodium: 142 MMOL/L (ref 135–145)
Total Bilirubin: 0.2 MG/DL (ref 0.0–1.0)
Total Protein: 7.1 GM/DL (ref 6.4–8.2)

## 2014-08-04 LAB — URINALYSIS WITH MICROSCOPIC
Bilirubin Urine: NEGATIVE MG/DL
Blood, Urine: NEGATIVE
Glucose, Urine: NEGATIVE MG/DL
Ketones, Urine: NEGATIVE MG/DL
Leukocyte Esterase, Urine: NEGATIVE
Nitrite Urine, Quantitative: NEGATIVE
Protein, UA: NEGATIVE MG/DL
RBC, UA: 1 /HPF (ref 0–6)
Renal Epithelial, UA: <1 /HPF
Specific Gravity, UA: 1.021 (ref 1.001–1.035)
Squam Epithel, UA: 2 /HPF
Urobilinogen, Urine: NORMAL EU/DL (ref 0.2–1.0)
WBC, UA: 1 /HPF (ref 0–5)
pH, Urine: 5 (ref 5.0–8.0)

## 2014-08-04 LAB — C-REACTIVE PROTEIN: CRP, High Sensitivity: 17.1 mg/L

## 2014-08-04 LAB — SEDIMENTATION RATE: Sed Rate: 25 MM/HR — ABNORMAL HIGH (ref 0–20)

## 2014-08-04 MED ORDER — PREDNISONE 10 MG PO TABS
10 MG | ORAL_TABLET | Freq: Two times a day (BID) | ORAL | 0 refills | Status: AC
Start: 2014-08-04 — End: 2014-08-10

## 2014-08-06 LAB — STREP SCREEN GROUP A THROAT
Culture: NEGATIVE
Strep A Direct Screen: NEGATIVE

## 2014-09-21 ENCOUNTER — Ambulatory Visit: Admit: 2014-09-21 | Discharge: 2014-09-21 | Payer: PRIVATE HEALTH INSURANCE | Attending: Internal Medicine

## 2014-09-21 DIAGNOSIS — G8929 Other chronic pain: Secondary | ICD-10-CM

## 2014-09-21 DIAGNOSIS — R519 Headache, unspecified: Secondary | ICD-10-CM

## 2014-09-21 MED ORDER — GABAPENTIN 300 MG PO CAPS
300 MG | ORAL_CAPSULE | Freq: Three times a day (TID) | ORAL | 0 refills | Status: AC
Start: 2014-09-21 — End: ?

## 2014-09-21 NOTE — Progress Notes (Signed)
Jessica Krause   29 y.o.  female    Chief Complaint   Patient presents with   ??? Established New Doctor     Prev Dr.Alobosi   ??? Eye Problem     BT eyes in corner of eyes see a black splat        Subjective:  29 y.o.female is here as a New Patient to establish long-term care. She has the following chronic/acute medical problems:   ??? Bilateral hand numbness   ??? Carpal tunnel syndrome   ??? Ankle syndesmosis disruption   ??? Morbid obesity due to excess calories (HCC)   ??? Chronic intractable headache   ??? Former smoker     Wakes up with eye pain. Worse with intercourse. Only thing that helps is coffee. Got lumbar puncture for possible increased intracranial pressure. LPs made her headaches worse. When she is having the pain, her pupils are unequal. Caffeine makes headache better. Ibuprofen helps.  Had back pain after LP. Muscle relaxer made her back pain much more severe.   Seen neurosurgery, who doesn't think she has Pseudotumor Cerebri.   Neuro-ophthalmologist: Dr. Myrtis Ser.    Review of Systems   Constitutional: Negative for appetite change, chills, fever and unexpected weight change.   HENT: Negative for congestion and sore throat.    Eyes: Negative for pain and visual disturbance.   Respiratory: Negative for cough, shortness of breath and wheezing.    Cardiovascular: Negative for chest pain, palpitations and leg swelling.   Gastrointestinal: Negative for abdominal pain, constipation, diarrhea, nausea and vomiting.   Endocrine: Negative for cold intolerance and heat intolerance.   Genitourinary: Negative for dysuria and hematuria.   Musculoskeletal: Negative for back pain and neck pain.   Skin: Negative for rash.   Allergic/Immunologic: Negative for environmental allergies.   Neurological: Positive for headaches. Negative for dizziness and numbness.   Hematological: Negative for adenopathy.   Psychiatric/Behavioral: Negative for decreased concentration and sleep disturbance. The patient is not nervous/anxious.        Current  Outpatient Prescriptions   Medication Sig Dispense Refill   ??? phentermine (ADIPEX-P) 37.5 MG capsule Take 37.5 mg by mouth every morning     ??? Multiple Vitamins-Minerals (MULTIVITAMIN ADULT PO) Take by mouth     ??? gabapentin (NEURONTIN) 300 MG capsule Take 1 capsule by mouth 3 times daily 90 capsule 0   ??? ibuprofen (ADVIL;MOTRIN) 800 MG tablet Take 800 mg by mouth every 6 hours as needed for Pain       No current facility-administered medications for this visit.       There are no discontinued medications.    Allergies   Allergen Reactions   ??? Acetazolamide Nausea And Vomiting   ??? Inderal [Propranolol Hcl] Anaphylaxis     Heart rate dropped, couldn't walk   ??? Cephalexin Diarrhea   ??? Pcn [Penicillins]    ??? Amoxicillin Rash     "bloody stool"     Past Medical History   Diagnosis Date   ??? Anesthesia      "I wake up freaking out after surgery"   ??? Arthritis    ??? Asthma    ??? Bipolar 1 disorder (HCC)    ??? Claustrophobia 09/16/2013     MRI BRAIN   ??? IBS (irritable bowel syndrome)      "was dx prior to gallbladder surgery but no trouble since the gb removed"   ??? Migraine    ??? Polycystic ovarian syndrome    ???  Pseudotumor cerebri      Past Surgical History   Procedure Laterality Date   ??? Anterior cruciate ligament repair       right knee 2010(pc)   ??? Cholecystectomy  age 52-2004   ??? Hysterectomy  age 56     "removed everything but the ovaries and had to repair my bladder"   ??? Dental surgery       "wisdom teeth removed age 80, put to sleep"   ??? Carpal tunnel release  12/21/10     right hand   ??? Carpal tunnel release  01/18/11     left carpal tunnel release      Family History   Problem Relation Age of Onset   ??? Mental Illness Mother    ??? Heart Disease Father      MI   ??? High Blood Pressure Father    ??? Depression Maternal Aunt    ??? Depression Maternal Uncle    ??? Depression Paternal Aunt    ??? Depression Paternal Uncle      Social History     Social History   ??? Marital status: Married     Spouse name: N/A   ??? Number of children:  N/A   ??? Years of education: N/A     Occupational History   ??? Not on file.     Social History Main Topics   ??? Smoking status: Former Smoker     Packs/day: 0.50     Years: 10.00     Quit date: 12/06/2011   ??? Smokeless tobacco: Never Used   ??? Alcohol use No      Comment: social, quit over a year ago   ??? Drug use: No   ??? Sexual activity: Yes     Partners: Male     Other Topics Concern   ??? Not on file     Social History Narrative       Objective:  Visit Vitals   ??? BP 120/82   ??? Pulse 92   ??? Temp 98.8 ??F (37.1 ??C)   ??? Ht  (1.727 m)   ??? Wt 270 lb (122.5 kg)   ??? BMI 41.05 kg/m2     BP Readings from Last 3 Encounters:   09/21/14 120/82   08/04/14 125/67   02/15/14 116/59     Wt Readings from Last 3 Encounters:   09/21/14 270 lb (122.5 kg)   08/03/14 285 lb (129.3 kg)   02/15/14 285 lb (129.3 kg)     Physical Exam   Constitutional: She is oriented to person, place, and time. She appears well-developed and well-nourished. No distress.   Morbidly obese   HENT:   Head: Normocephalic and atraumatic.   Eyes: Conjunctivae and EOM are normal. Pupils are equal, round, and reactive to light. No scleral icterus.   Neck: Normal range of motion. Neck supple. No JVD present. No thyromegaly present.   Cardiovascular: Normal rate and regular rhythm.    No murmur heard.  Pulmonary/Chest: Effort normal and breath sounds normal. No respiratory distress. She has no wheezes.   Abdominal: Soft. Bowel sounds are normal. She exhibits no distension. There is no tenderness.   Musculoskeletal: Normal range of motion. She exhibits no edema or tenderness.   Lymphadenopathy:     She has no cervical adenopathy.   Neurological: She is alert and oriented to person, place, and time. No cranial nerve deficit.   Skin: Skin is warm and dry.   Psychiatric: She  has a normal mood and affect. Judgment normal.       Lab Results   Component Value Date    WBC 12.1 (H) 08/04/2014    HGB 13.9 08/04/2014    HCT 41.2 08/04/2014    MCV 87.2 08/04/2014    PLT 286  08/04/2014     Lab Results   Component Value Date    NA 142 08/04/2014    K 3.9 08/04/2014    CL 105 08/04/2014    CO2 24 08/04/2014    BUN 16 08/04/2014    CREATININE 0.6 08/04/2014    CALCIUM 9.1 08/04/2014    PROT 7.1 08/04/2014    LABALBU 4.1 08/04/2014    BILITOT 0.2 08/04/2014    ALKPHOS 74 08/04/2014    AST 13 (L) 08/04/2014    ALT 14 08/04/2014    LABGLOM >60 08/04/2014    GFRAA >60 08/04/2014       ASSESSMENT:      1. Chronic intractable headache, unspecified headache type    2. Morbid obesity due to excess calories (HCC)    3. Annual physical exam    4. Former smoker        PLAN:  1.  Neurology referral for 2nd opinion.  2.  Continue weight loss efforts.  3.  Start gabapentin.  4. See orders below    Orders Placed This Encounter   Medications   ??? gabapentin (NEURONTIN) 300 MG capsule     Sig: Take 1 capsule by mouth 3 times daily     Dispense:  90 capsule     Refill:  0     Orders Placed This Encounter   Procedures   ??? Hemoglobin A1C   ??? Lipid Panel   ??? External Referral To Neurology       Health Maintenance   Topic Date Due   ??? Tetanus (DTaP/Tdap/Td) (1 - Tdap) 10/19/2003   ??? Flu Vaccine (1) 09/07/2014   ??? HIV SCREENING  Completed       Return in about 4 weeks (around 10/19/2014).        Signed:  Gweneth Dimitri, MD  09/21/14  11:43 AM

## 2014-10-15 ENCOUNTER — Encounter

## 2014-10-20 ENCOUNTER — Encounter

## 2014-10-22 ENCOUNTER — Encounter: Attending: Internal Medicine

## 2020-06-24 IMAGING — MR MRV BRAIN WITHOUT CONTRAST
2 of 3 series · 27 of 48 positions shown · non-contrast
Comparison: none

MRv BRAIN WITHOUT CONTRAST, 06/24/2020 [DATE]: 
CLINICAL INDICATION: Headache. Compression of vein. Personal history of other 
diseases of the nervous system incense organs.
TECHNIQUE: Multiplanar MRV images were obtained of the brain without contrast. 
2-D and 3-D reconstructions were performed. Exam is read in conjunction with MRA 
examination of the brain performed the same day.

[Series 601: (id) ax · axial · 3.0mm · 0.45mm/px · z∈[-103,+65]mm · 17 of 90 slices shown]
[im 1/90]
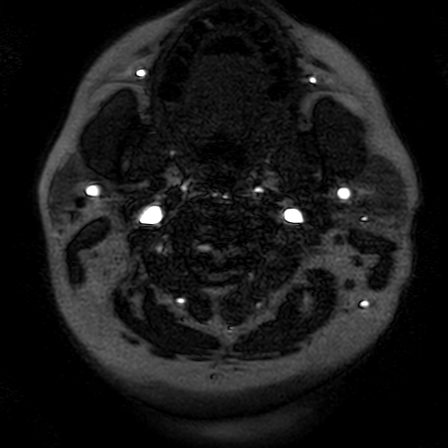
[im 5/90]
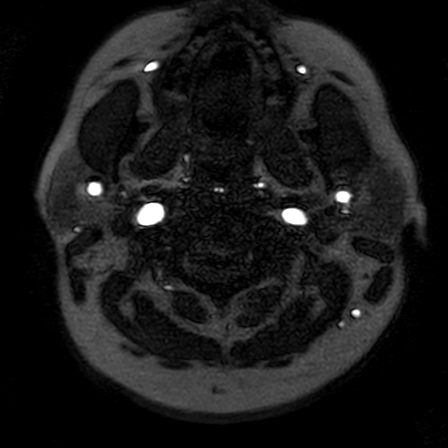
[im 9/90]
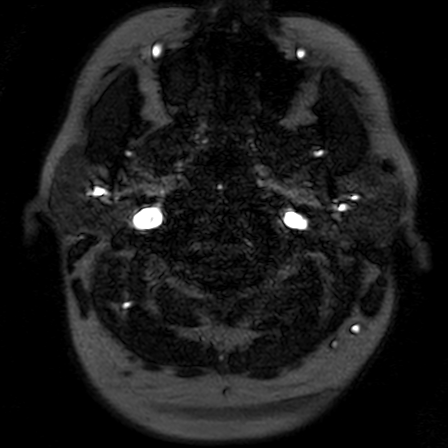
[im 13/90]
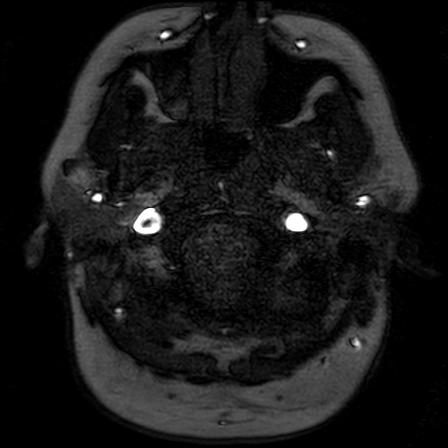
[im 17/90]
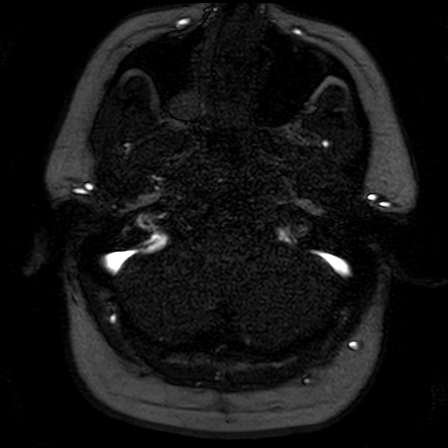
[im 21/90]
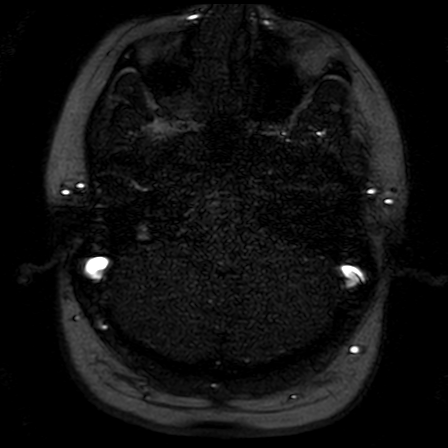
[im 25/90]
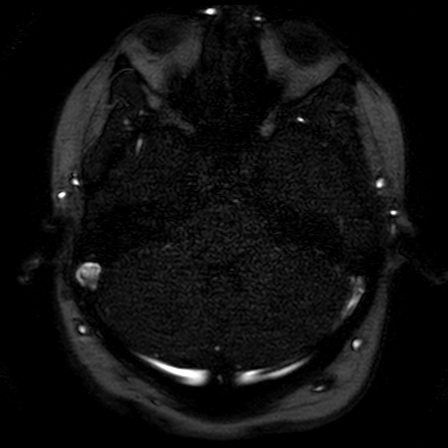
[im 29/90]
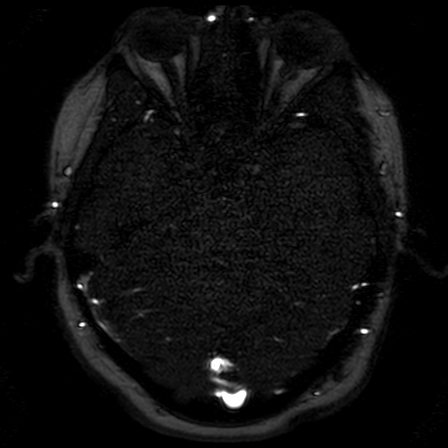
[im 33/90]
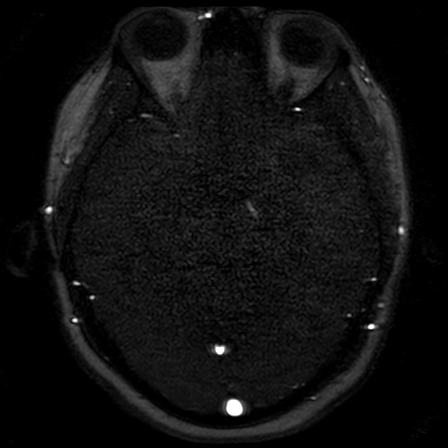
[im 37/90]
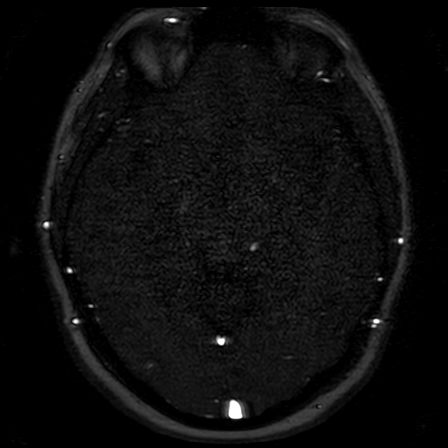
[im 41/90]
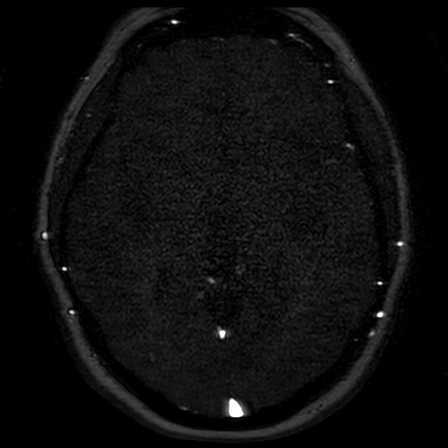
[im 45/90]
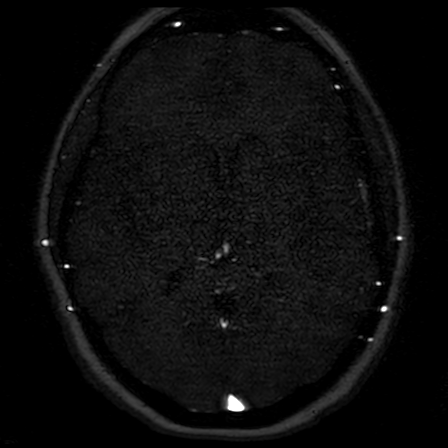
[im 49/90]
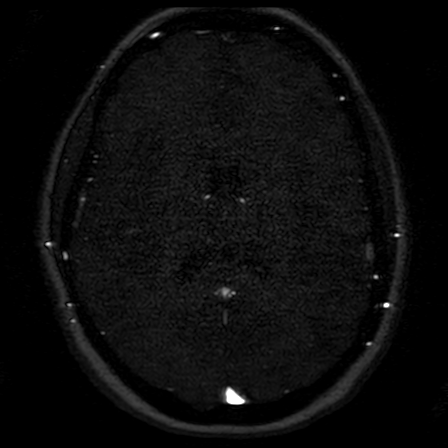
[im 61/90]
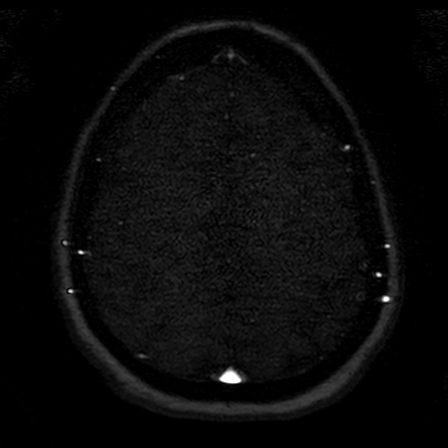
[im 73/90]
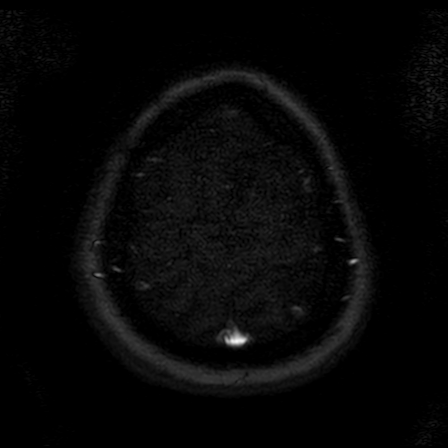
[im 77/90]
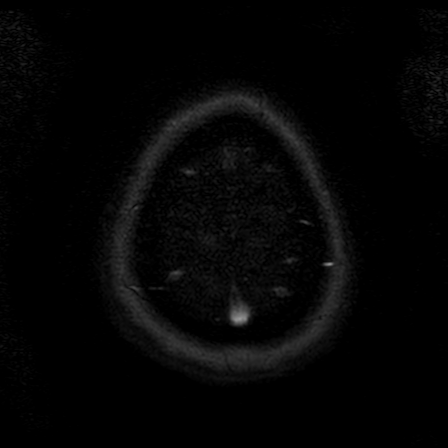
[im 85/90]
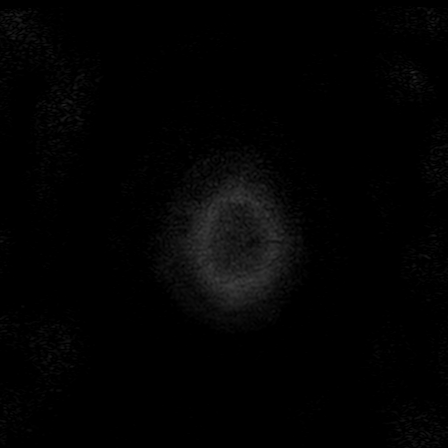

[Series 701: (id) cor · coronal · 3.0mm · 0.45mm/px · 10 of 90 slices shown]
[im 4/90]
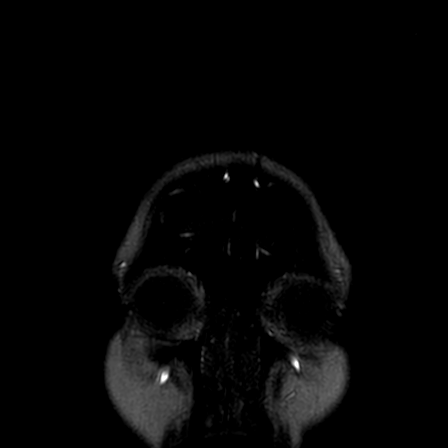
[im 16/90]
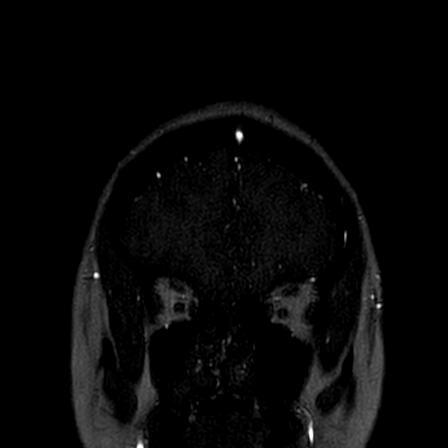
[im 28/90]
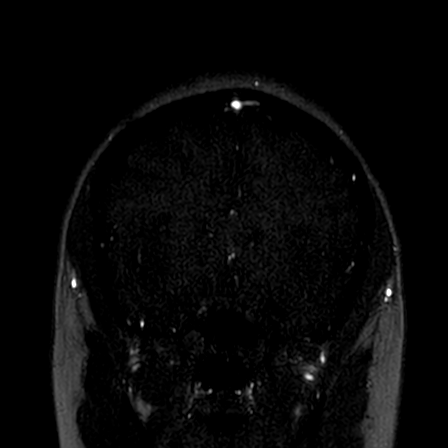
[im 39/90]
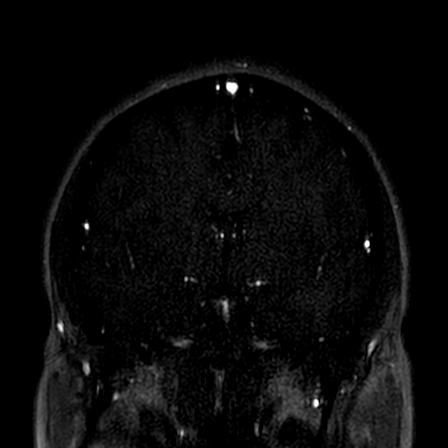
[im 47/90]
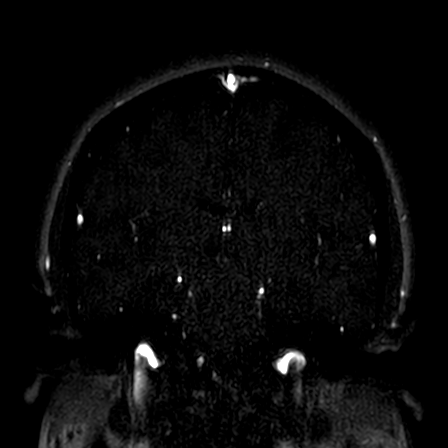
[im 51/90]
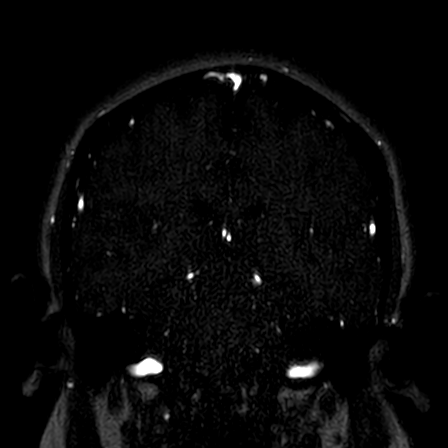
[im 62/90]
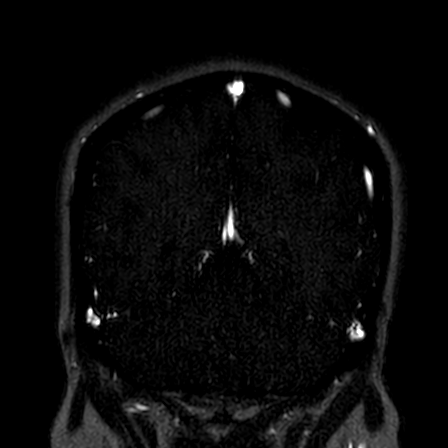
[im 74/90]
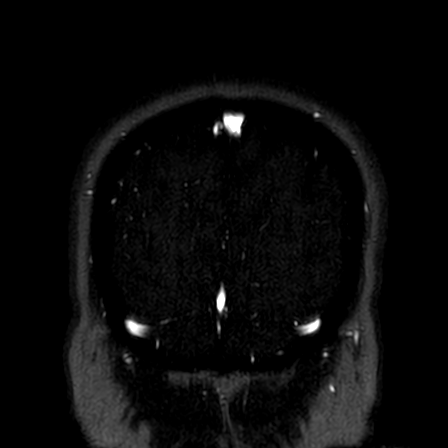
[im 78/90]
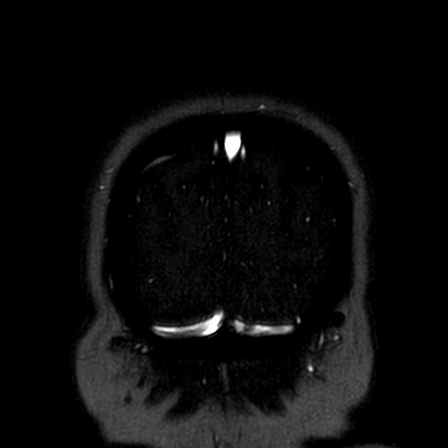
[im 86/90]
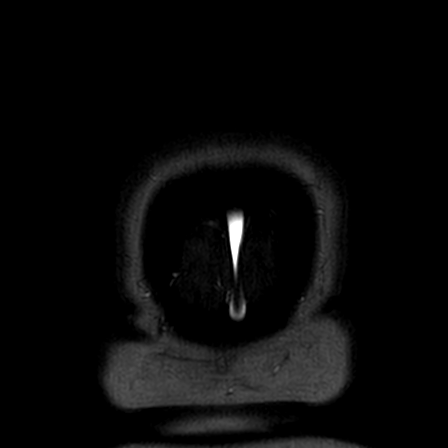

[27 of 48 positions shown; findings below may reference images not displayed]

FINDINGS: Time-of-flight images demonstrate normal flow within the cerebral 
veins including the superior sagittal sinus, transverse and sigmoid sinuses. No 
abnormal flow void or thrombosis.
IMPRESSION: Normal MRV examination of the brain.

## 2020-06-24 IMAGING — MR MRA BRAIN WITHOUT CONTRAST
8 series · 34 of 48 positions shown · non-contrast
Comparison: None

MRA BRAIN WITHOUT CONTRAST, 06/24/2020 [DATE]: 
CLINICAL INDICATION:  Headache. Compression of vein. Personal history of other 
diseases of the nervous system and sense organs.
TECHNIQUE: 3-D phase contrast MR arteriography with MIPs of the brain is 
performed with computer reformatting of the source data to create arteriographic 
images. Additionally, axial FLAIR, diffusion weighted series and coronal T2 
series were performed.

[Series 101: survey · axial · 10.0mm · 0.94mm/px · z∈[+2,+153]mm · 2 of 5 slices shown]
[im 1/5]
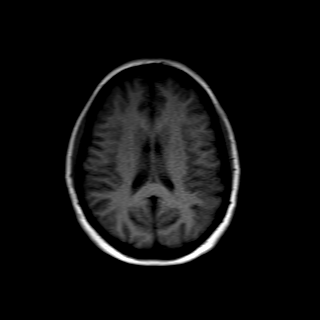
[im 5/5]
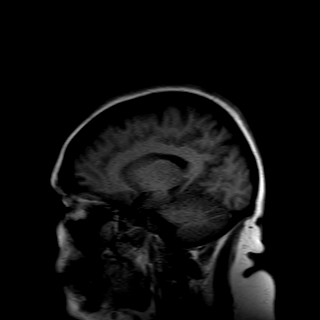

[Series 201: flair_ax · axial · 5.0mm · 0.49mm/px · z∈[-84,+72]mm · 3 of 27 slices shown]
[im 1/27]
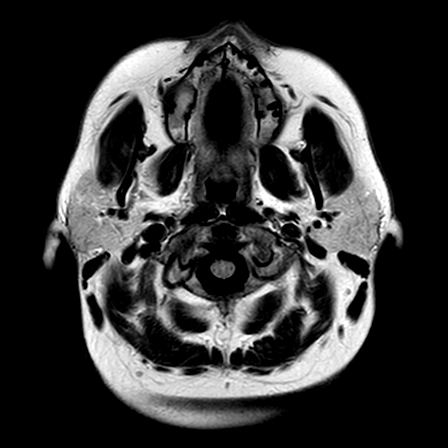
[im 14/27]
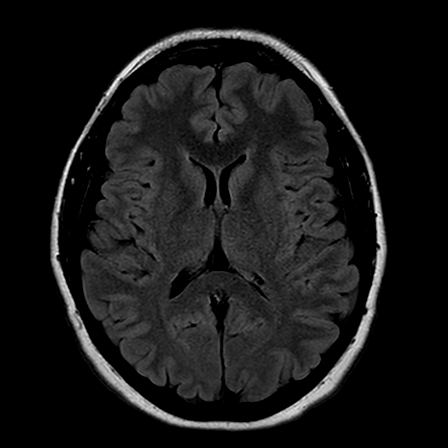
[im 27/27]
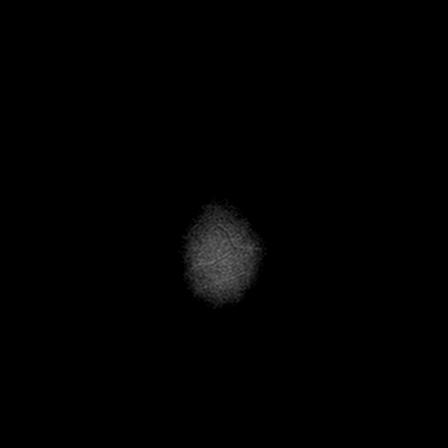

[Series 301: dwi_ax · axial · 5.0mm · 1.03mm/px · z∈[-84,+72]mm · 6 of 54 slices shown]
[im 1/54]
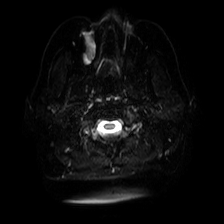
[im 11/54]
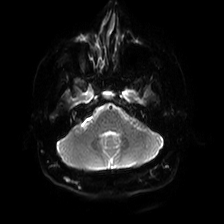
[im 22/54]
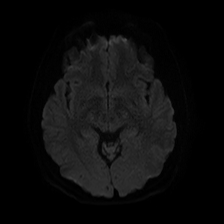
[im 32/54]
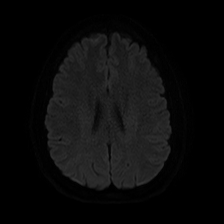
[im 43/54]
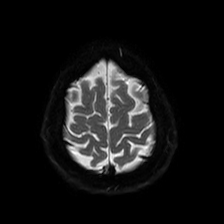
[im 54/54]
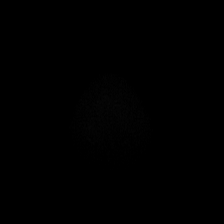

[Series 303: dadc map · axial · 5.0mm · 1.03mm/px · z∈[-84,+72]mm · 3 of 27 slices shown]
[im 1/27]
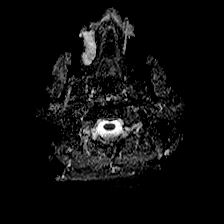
[im 14/27]
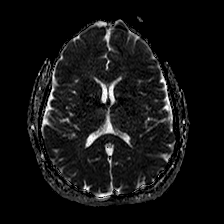
[im 27/27]
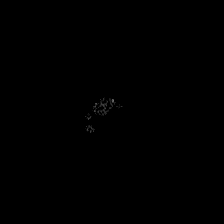

[Series 304: sb0 · axial · 5.0mm · 1.03mm/px · z∈[-84,+72]mm · 3 of 27 slices shown]
[im 1/27]
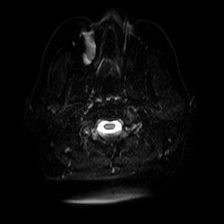
[im 14/27]
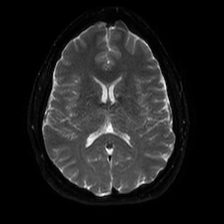
[im 27/27]
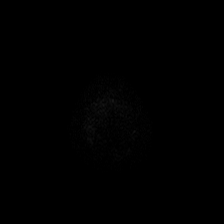

[Series 305: (id) · axial · 5.0mm · 1.03mm/px · z∈[-84,+72]mm · 3 of 27 slices shown]
[im 1/27]
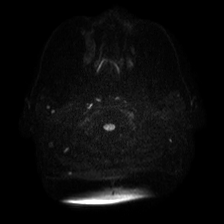
[im 14/27]
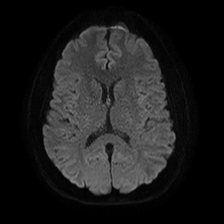
[im 27/27]
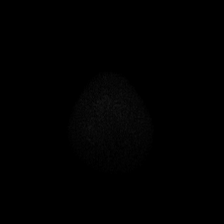

[Series 401: 3d_tof_cow · axial · 1.0mm · 0.38mm/px · z∈[-85,+10]mm · 10 of 200 slices shown]
[im 1/200]
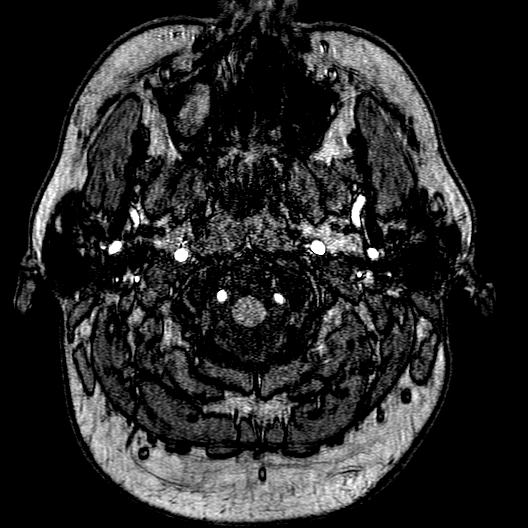
[im 9/200]
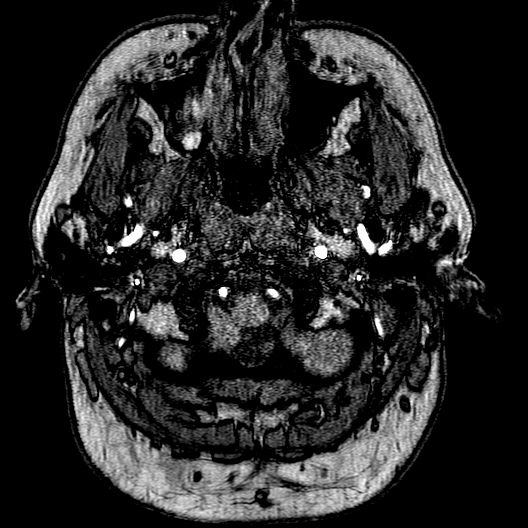
[im 18/200]
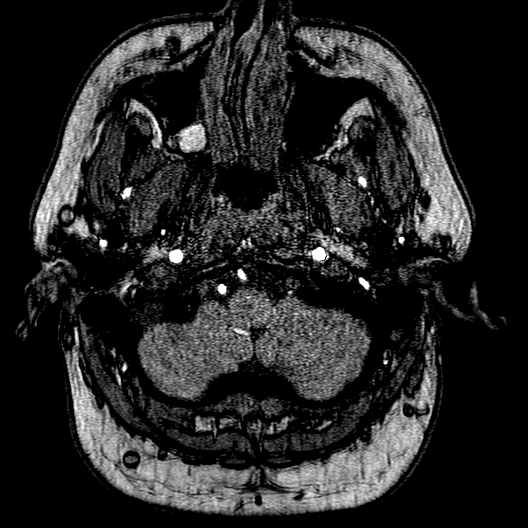
[im 35/200]
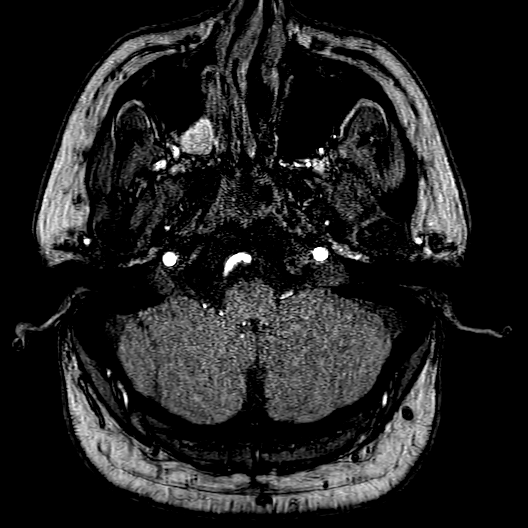
[im 61/200]
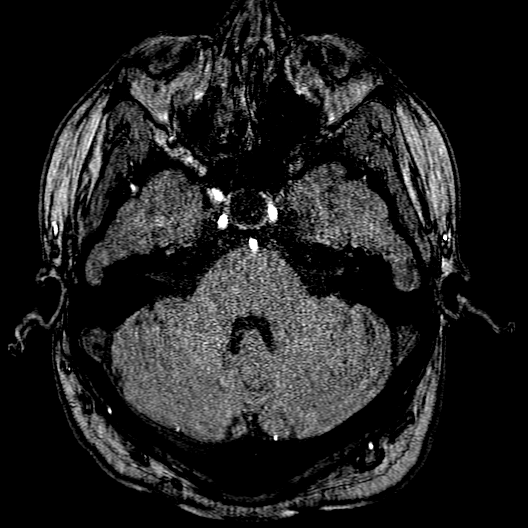
[im 87/200]
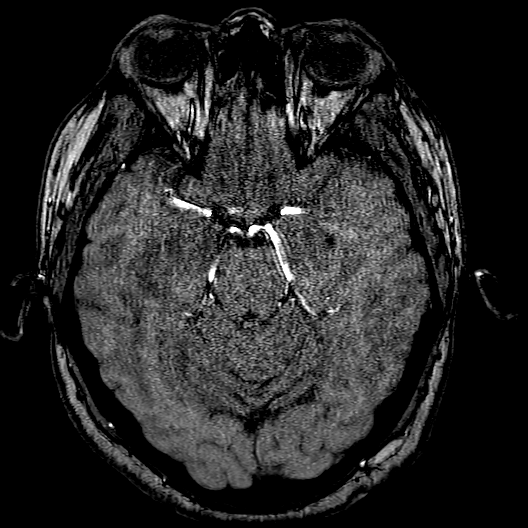
[im 113/200]
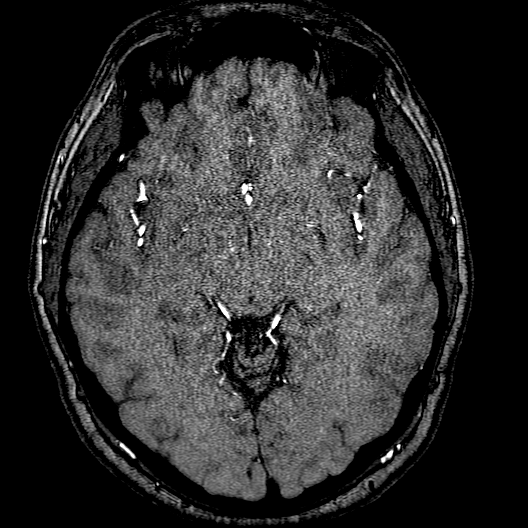
[im 139/200]
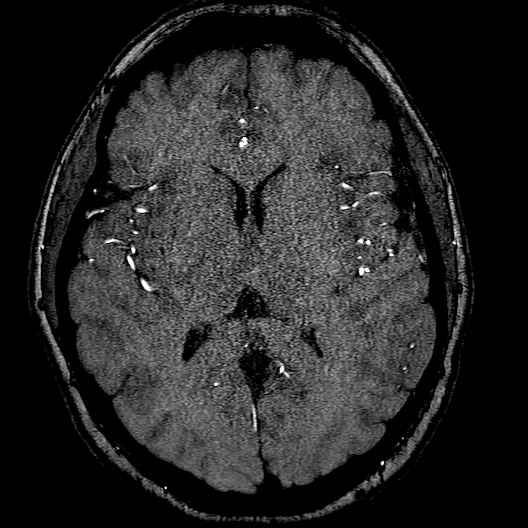
[im 165/200]
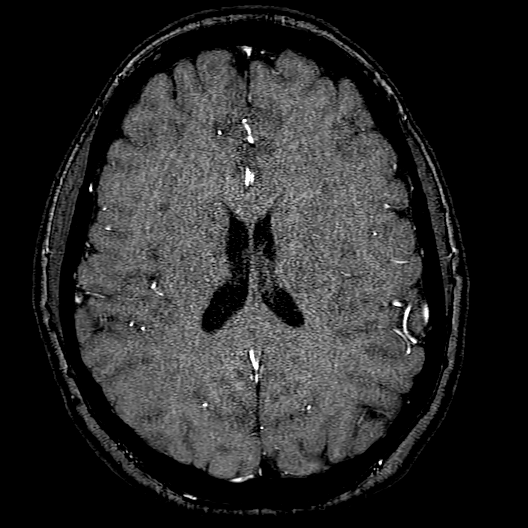
[im 191/200]
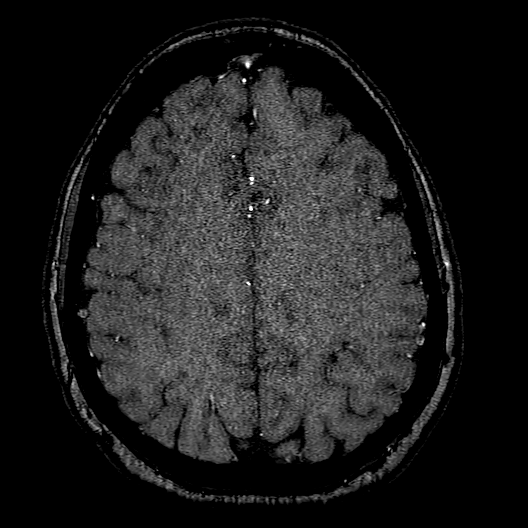

[Series 501: T2 · coronal · 2.0mm · 0.47mm/px · 4 of 36 slices shown]
[im 1/36]
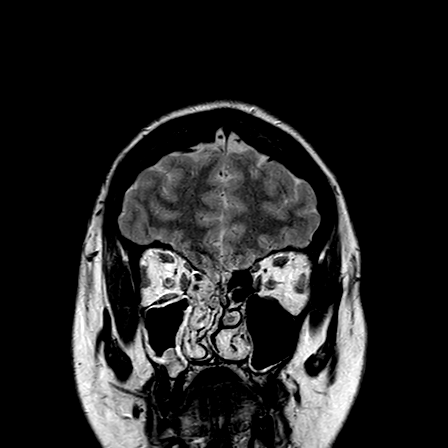
[im 12/36]
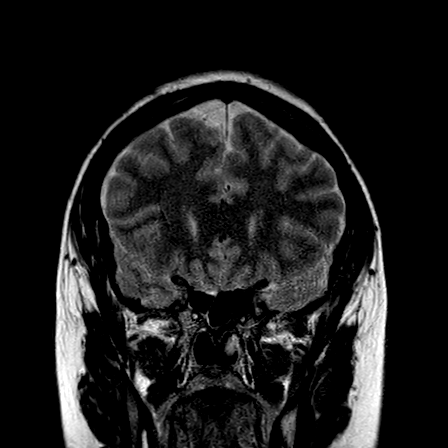
[im 24/36]
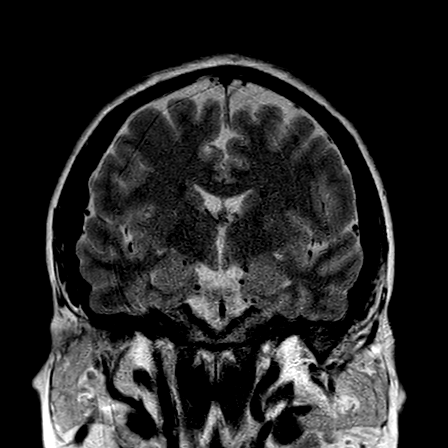
[im 36/36]
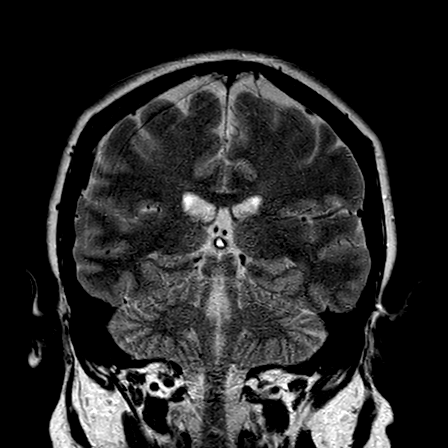

[34 of 48 positions shown; findings below may reference images not displayed]

FINDINGS: RIGHT ANTERIOR CIRCULATION: Distal internal carotid artery and anterior and 
middle cerebral arteries without significant stenosis. 
LEFT ANTERIOR CIRCULATION: Distal internal carotid artery and anterior and 
middle cerebral arteries without significant stenosis. 
POSTERIOR CIRCULATION: Bilateral posterior communicating arteries. Distal 
vertebral arteries, basilar artery, and proximal posterior cerebral arteries 
without significant stenosis. 
ANEURYSM/AVM: No aneurysm measuring at least 3 mm identified. There is no 
vascular nidus to suggest high-grade arteriovenous malformation. 
No acute infarct. No mass. No acute or chronic intracranial hemorrhage. No 
extra-axial fluid collection. White matter is preserved. Normal ventricular 
size. Mild mucoperiosteal membrane thickening right maxillary sinus. Calvarium 
is intact.
IMPRESSION: Chronic right maxillary sinusitis. Otherwise, normal MRA exam.

## 2020-07-12 IMAGING — MR MRI BRAIN W/WO CONTRAST
12 of 18 series · 26 of 48 positions shown · IV contrast (gadavist)
Comparison: Brain MRA June 24, 2020

FINAL Diagnostic Imaging Report 
________________________________________________________________________________________________ 
MRI BRAIN W/WO CONTRAST, 07/12/2020 [DATE]: 
CLINICAL INDICATION: Headache, evaluate for hydrocephalus
TECHNIQUE: Multiplanar, multiecho position MR images of the brain were performed 
without and with 5 mL of Gadavist. Patient was scanned on a 1.5 Tesla magnet.

[Series 101: survey · axial · 10.0mm · 0.94mm/px · 1 of 5 slices shown]
[im 1/5]
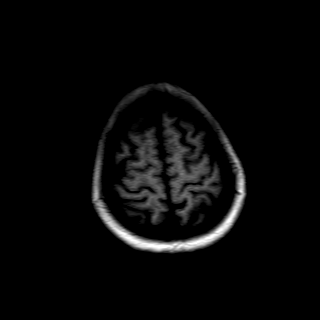

[Series 201: t1_se_sag · sagittal · 4.0mm · 0.49mm/px · 1 of 28 slices shown]
[im 1/28]
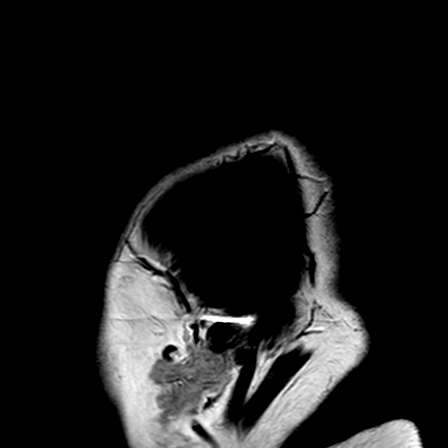

[Series 301: flair_ax · axial · 5.0mm · 0.49mm/px · 1 of 27 slices shown]
[im 1/27]
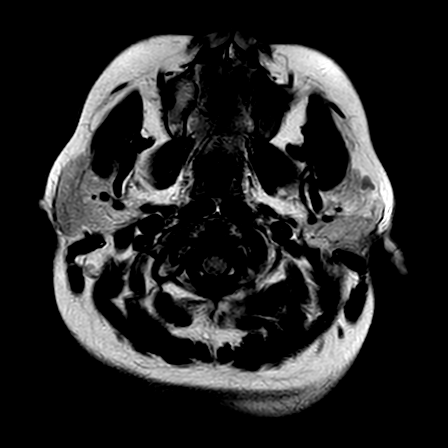

[Series 501: dwi_ax · axial · 5.0mm · 1.03mm/px · z∈[-80,+76]mm · 2 of 54 slices shown]
[im 1/54]
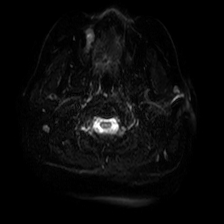
[im 54/54]
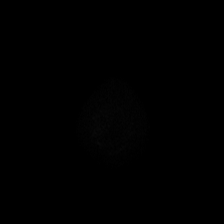

[Series 503: dadc map · axial · 5.0mm · 1.03mm/px · z∈[-80,+76]mm · 2 of 27 slices shown]
[im 1/27]
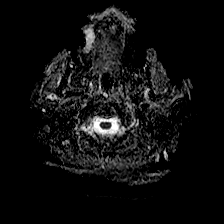
[im 27/27]
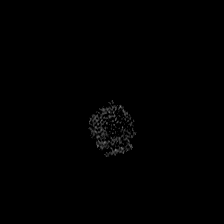

[Series 504: sb0 · axial · 5.0mm · 1.03mm/px · z∈[-80,+76]mm · 2 of 27 slices shown]
[im 1/27]
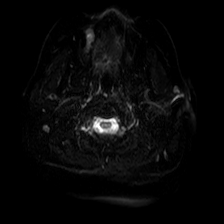
[im 27/27]
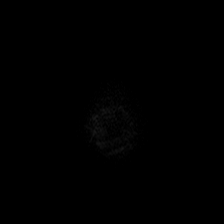

[Series 505: (id) · axial · 5.0mm · 1.03mm/px · z∈[-80,+76]mm · 2 of 27 slices shown]
[im 1/27]
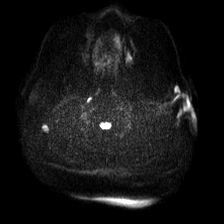
[im 27/27]
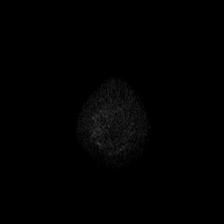

[Series 601: SWI · axial · 3.0mm · 0.39mm/px · z∈[-76,+73]mm · 9 of 200 slices shown]
[im 1/200]
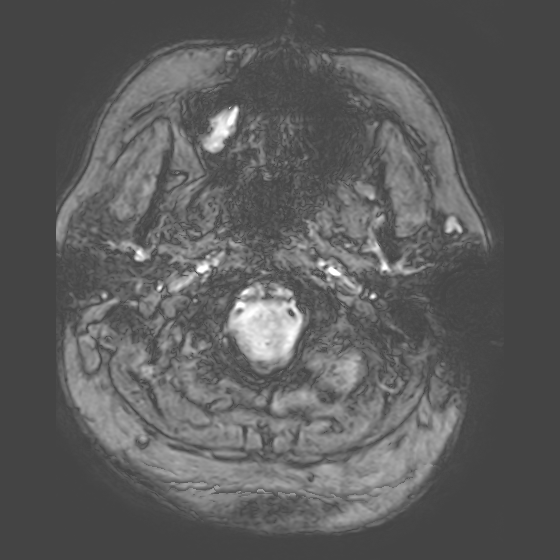
[im 34/200]
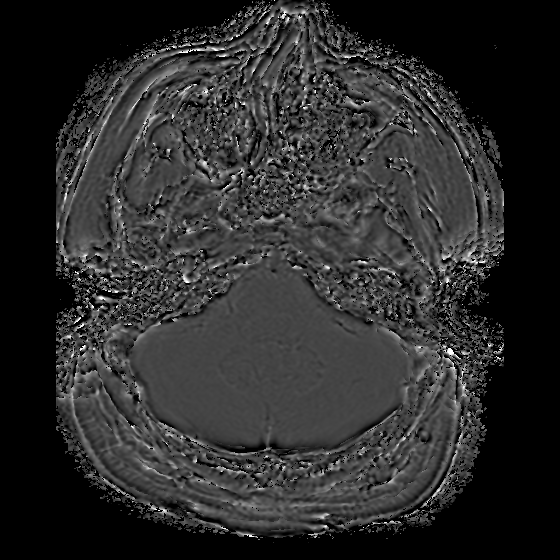
[im 67/200]
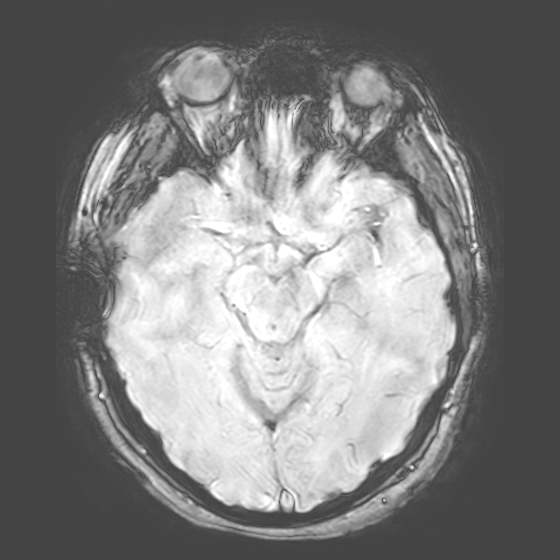
[im 83/200]
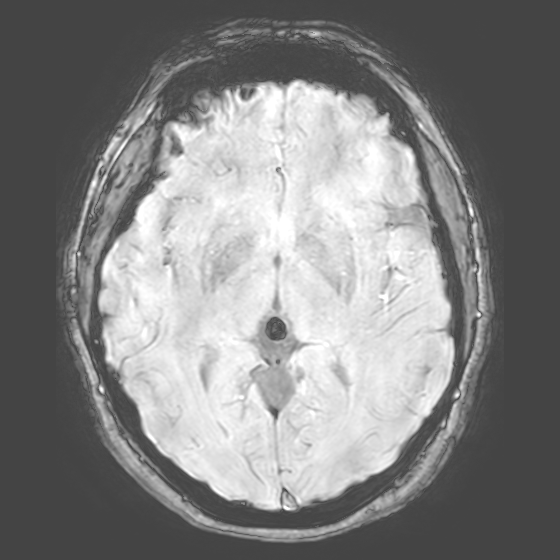
[im 100/200]
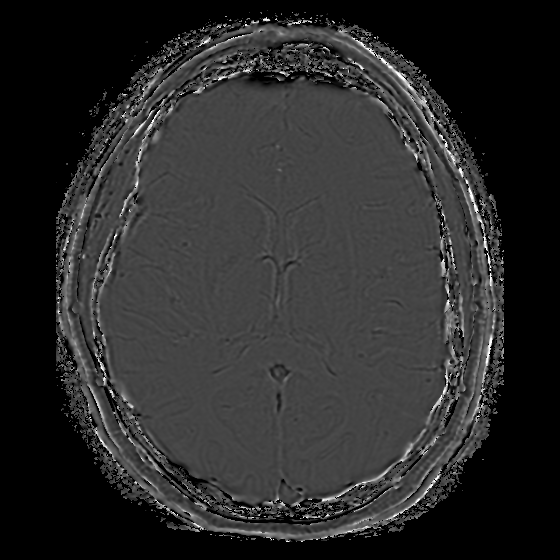
[im 117/200]
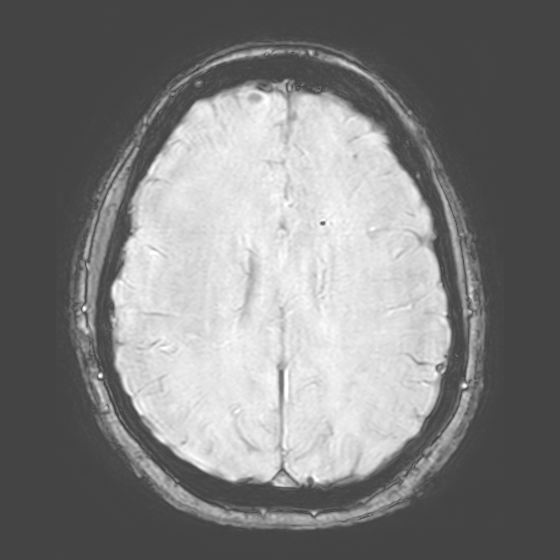
[im 133/200]
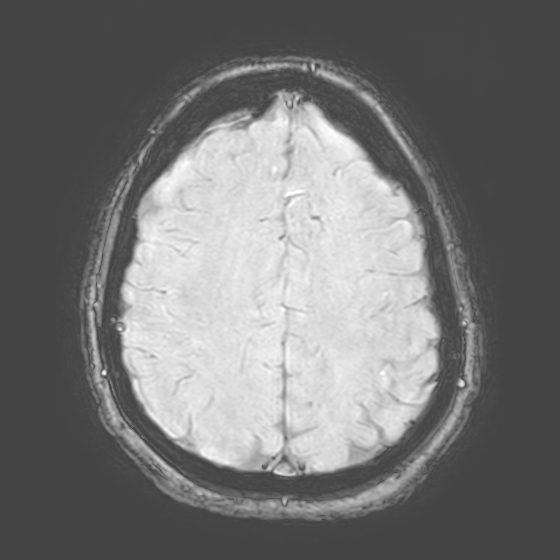
[im 166/200]
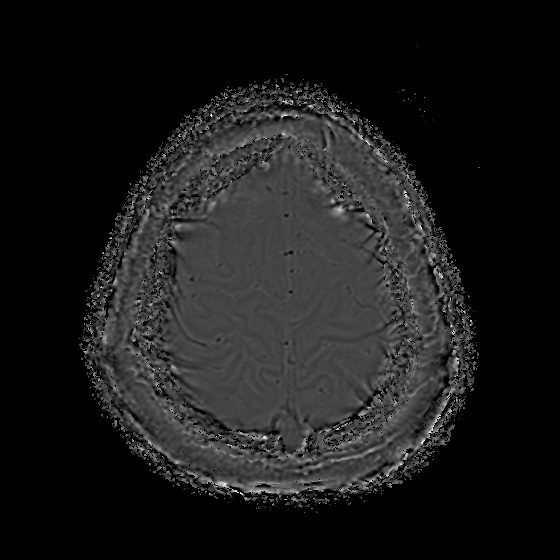
[im 200/200]
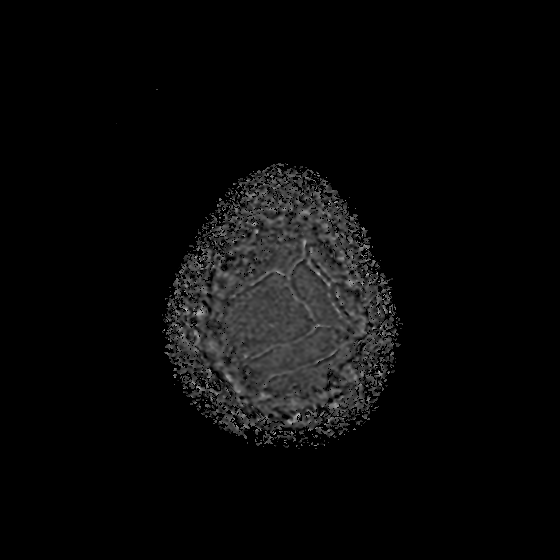

[Series 602: swip · axial · 10.0mm · 0.38mm/px · 1 of 140 slices shown]
[im 1/140]
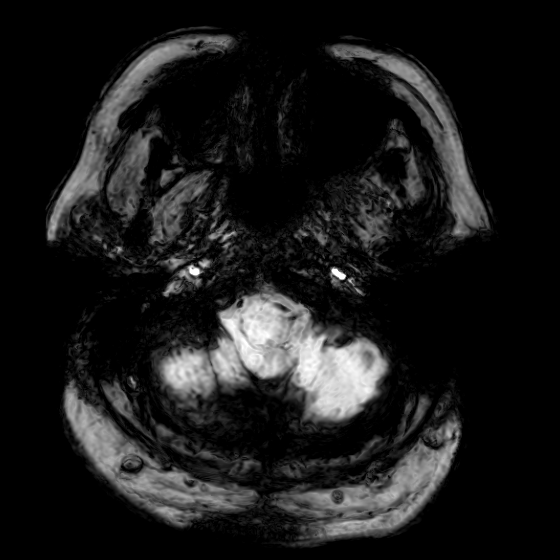

[Series 1601: T1 post-contrast · axial · 2.5mm · 0.33mm/px · 1 of 20 slices shown (1 of 3)]
[im 1/20]
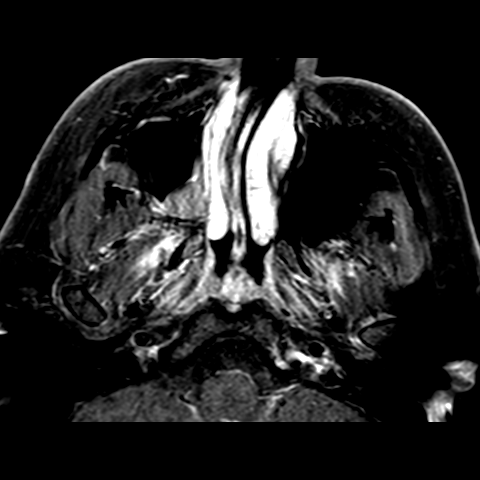

[Series 1701: T1 post-contrast · coronal · 3.0mm · 0.37mm/px · 2 of 32 slices shown (2 of 3)]
[im 1/32]
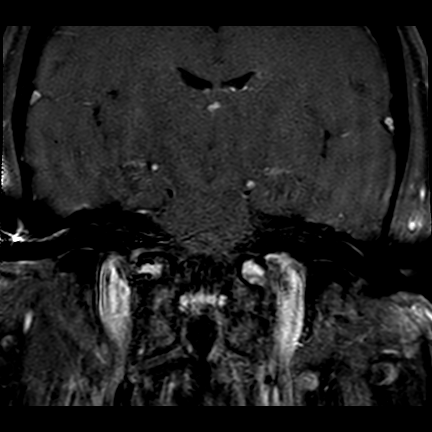
[im 32/32]
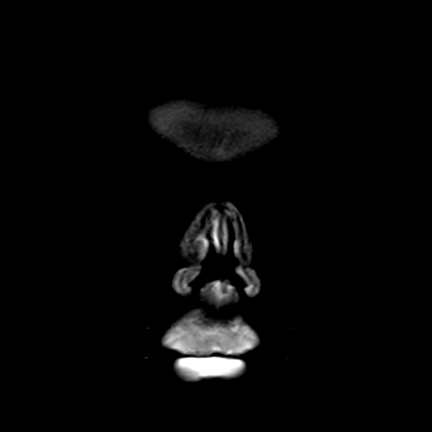

[Series 1801: T1 post-contrast · axial · 5.0mm · 0.43mm/px · z∈[-75,+81]mm · 2 of 27 slices shown (3 of 3)]
[im 1/27]
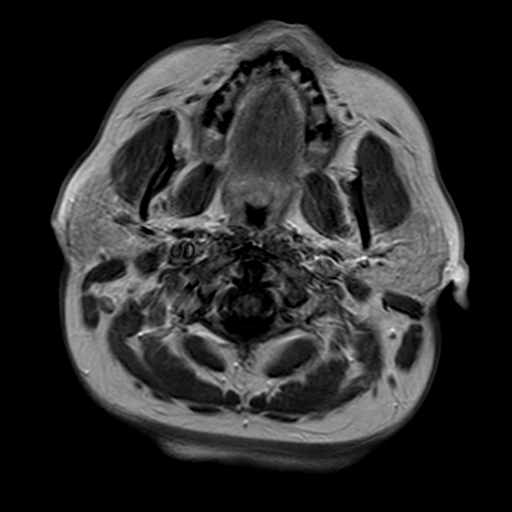
[im 27/27]
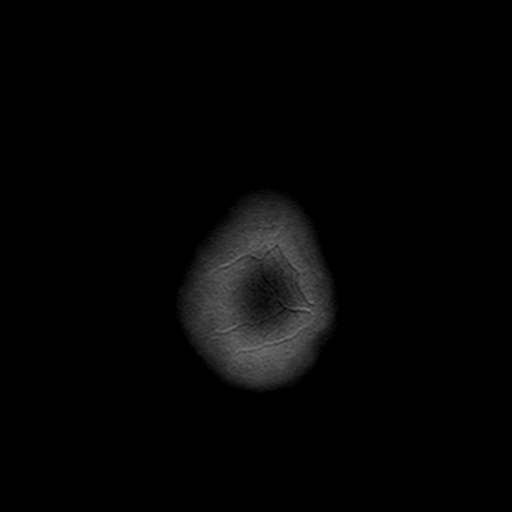

[26 of 48 positions shown; findings below may reference images not displayed]

FINDINGS: This study is limited by motion artifact. 
There is mild prominence of sulci along the upper convexities, without 
significant cortical atrophy. There is a partly empty sella which is likely 
related to dehiscence of the sellar diaphragm. Pituitary gland height no greater 
than 2 mm. Axial enhanced images show lack of displacement of the infundibulum. 
No evidence for sellar or juxtasellar mass. 
There is no hydrocephalus. No evidence for intracranial mass. Incidental note 
made of a 3 mm pineal region cyst, most likely benign incidental finding. This 
does not require further follow-up. Diffusion-weighted images are negative. 
There is no discrete brainstem or cerebellar lesion. Major intracranial arterial 
segments are open. There is no pathologic enhancement other than a small venous 
anomaly in the paramedian left frontal lobe, without clinical significance. 
There are retention cysts in the right maxillary alveolar recess. Other 
paranasal sinuses, tympanic cavities and mastoid air cells are clear. No orbital 
mass.
IMPRESSION: There is a partly empty sella, likely related to dehiscence of the sellar 
diaphragm. There is no sellar or juxtasellar mass. This is a potential source of 
headache. Pituitary gland height is approximately 2 mm, and correlation with 
potential hypopituitarism advised. 
There is no intracranial mass or hydrocephalus. 
Right maxillary retention cysts.
